# Patient Record
Sex: Female | Born: 1980 | Race: Black or African American | Hispanic: No | Marital: Single | Smoking: Current every day smoker
Health system: Southern US, Community
[De-identification: ages and names within clinical notes are randomized; demographics above are authoritative.]

## PROBLEM LIST (undated history)

## (undated) ENCOUNTER — Inpatient Hospital Stay (HOSPITAL_COMMUNITY): Payer: Self-pay

## (undated) DIAGNOSIS — A6000 Herpesviral infection of urogenital system, unspecified: Secondary | ICD-10-CM

## (undated) DIAGNOSIS — G43909 Migraine, unspecified, not intractable, without status migrainosus: Secondary | ICD-10-CM

## (undated) DIAGNOSIS — F1410 Cocaine abuse, uncomplicated: Secondary | ICD-10-CM

## (undated) HISTORY — PX: ARTERY REPAIR: SHX559

## (undated) HISTORY — PX: FOOT SURGERY: SHX648

---
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
## 2002-04-18 ENCOUNTER — Ambulatory Visit (HOSPITAL_COMMUNITY): Admission: RE | Admit: 2002-04-18 | Discharge: 2002-04-18 | Payer: Self-pay | Admitting: *Deleted

## 2002-06-22 ENCOUNTER — Encounter (INDEPENDENT_AMBULATORY_CARE_PROVIDER_SITE_OTHER): Payer: Self-pay | Admitting: Specialist

## 2002-06-22 ENCOUNTER — Inpatient Hospital Stay (HOSPITAL_COMMUNITY): Admission: AD | Admit: 2002-06-22 | Discharge: 2002-06-25 | Payer: Self-pay | Admitting: Family Medicine

## 2002-06-27 ENCOUNTER — Inpatient Hospital Stay (HOSPITAL_COMMUNITY): Admission: AD | Admit: 2002-06-27 | Discharge: 2002-06-27 | Payer: Self-pay | Admitting: *Deleted

## 2002-09-05 ENCOUNTER — Encounter: Payer: Self-pay | Admitting: Emergency Medicine

## 2002-09-05 ENCOUNTER — Emergency Department (HOSPITAL_COMMUNITY): Admission: EM | Admit: 2002-09-05 | Discharge: 2002-09-05 | Payer: Self-pay | Admitting: Emergency Medicine

## 2003-07-02 ENCOUNTER — Emergency Department (HOSPITAL_COMMUNITY): Admission: EM | Admit: 2003-07-02 | Discharge: 2003-07-02 | Payer: Self-pay | Admitting: Emergency Medicine

## 2003-07-02 ENCOUNTER — Encounter: Payer: Self-pay | Admitting: Emergency Medicine

## 2004-01-02 ENCOUNTER — Inpatient Hospital Stay (HOSPITAL_COMMUNITY): Admission: AD | Admit: 2004-01-02 | Discharge: 2004-01-03 | Payer: Self-pay | Admitting: *Deleted

## 2004-01-08 ENCOUNTER — Inpatient Hospital Stay (HOSPITAL_COMMUNITY): Admission: AD | Admit: 2004-01-08 | Discharge: 2004-01-08 | Payer: Self-pay | Admitting: Obstetrics & Gynecology

## 2004-01-27 ENCOUNTER — Inpatient Hospital Stay (HOSPITAL_COMMUNITY): Admission: AD | Admit: 2004-01-27 | Discharge: 2004-01-27 | Payer: Self-pay | Admitting: *Deleted

## 2004-02-18 ENCOUNTER — Ambulatory Visit (HOSPITAL_COMMUNITY): Admission: RE | Admit: 2004-02-18 | Discharge: 2004-02-18 | Payer: Self-pay | Admitting: Obstetrics and Gynecology

## 2004-03-04 ENCOUNTER — Encounter: Admission: RE | Admit: 2004-03-04 | Discharge: 2004-03-04 | Payer: Self-pay | Admitting: *Deleted

## 2004-03-25 ENCOUNTER — Encounter: Admission: RE | Admit: 2004-03-25 | Discharge: 2004-03-25 | Payer: Self-pay | Admitting: Family Medicine

## 2004-04-21 ENCOUNTER — Encounter: Admission: RE | Admit: 2004-04-21 | Discharge: 2004-04-21 | Payer: Self-pay | Admitting: *Deleted

## 2004-04-29 ENCOUNTER — Encounter: Admission: RE | Admit: 2004-04-29 | Discharge: 2004-04-29 | Payer: Self-pay | Admitting: *Deleted

## 2004-05-05 ENCOUNTER — Ambulatory Visit (HOSPITAL_COMMUNITY): Admission: RE | Admit: 2004-05-05 | Discharge: 2004-05-05 | Payer: Self-pay | Admitting: Obstetrics and Gynecology

## 2004-05-05 ENCOUNTER — Encounter: Admission: RE | Admit: 2004-05-05 | Discharge: 2004-05-05 | Payer: Self-pay | Admitting: *Deleted

## 2004-05-19 ENCOUNTER — Encounter: Admission: RE | Admit: 2004-05-19 | Discharge: 2004-05-19 | Payer: Self-pay | Admitting: *Deleted

## 2004-05-26 ENCOUNTER — Encounter: Admission: RE | Admit: 2004-05-26 | Discharge: 2004-05-26 | Payer: Self-pay | Admitting: *Deleted

## 2004-06-10 ENCOUNTER — Encounter: Admission: RE | Admit: 2004-06-10 | Discharge: 2004-06-10 | Payer: Self-pay | Admitting: Family Medicine

## 2004-06-17 ENCOUNTER — Encounter: Admission: RE | Admit: 2004-06-17 | Discharge: 2004-06-17 | Payer: Self-pay | Admitting: Family Medicine

## 2004-06-18 ENCOUNTER — Encounter (INDEPENDENT_AMBULATORY_CARE_PROVIDER_SITE_OTHER): Payer: Self-pay | Admitting: Specialist

## 2004-06-18 ENCOUNTER — Ambulatory Visit: Payer: Self-pay | Admitting: *Deleted

## 2004-06-18 ENCOUNTER — Inpatient Hospital Stay (HOSPITAL_COMMUNITY): Admission: AD | Admit: 2004-06-18 | Discharge: 2004-06-21 | Payer: Self-pay | Admitting: *Deleted

## 2005-06-22 ENCOUNTER — Emergency Department (HOSPITAL_COMMUNITY): Admission: EM | Admit: 2005-06-22 | Discharge: 2005-06-22 | Payer: Self-pay | Admitting: Emergency Medicine

## 2005-12-22 ENCOUNTER — Inpatient Hospital Stay (HOSPITAL_COMMUNITY): Admission: AD | Admit: 2005-12-22 | Discharge: 2005-12-22 | Payer: Self-pay | Admitting: Obstetrics & Gynecology

## 2007-03-08 ENCOUNTER — Ambulatory Visit: Payer: Self-pay | Admitting: Obstetrics & Gynecology

## 2007-03-08 ENCOUNTER — Encounter (INDEPENDENT_AMBULATORY_CARE_PROVIDER_SITE_OTHER): Payer: Self-pay | Admitting: Obstetrics & Gynecology

## 2007-03-29 ENCOUNTER — Encounter: Admission: RE | Admit: 2007-03-29 | Discharge: 2007-03-29 | Payer: Self-pay | Admitting: Obstetrics & Gynecology

## 2007-08-29 ENCOUNTER — Emergency Department (HOSPITAL_COMMUNITY): Admission: EM | Admit: 2007-08-29 | Discharge: 2007-08-29 | Payer: Self-pay | Admitting: Family Medicine

## 2010-03-17 ENCOUNTER — Encounter: Payer: Self-pay | Admitting: Obstetrics & Gynecology

## 2010-03-17 ENCOUNTER — Ambulatory Visit: Payer: Self-pay | Admitting: Obstetrics and Gynecology

## 2010-03-17 LAB — CONVERTED CEMR LAB
HCV Ab: NEGATIVE
Hepatitis B Surface Ag: NEGATIVE
Pap Smear: NEGATIVE

## 2010-03-18 ENCOUNTER — Encounter: Payer: Self-pay | Admitting: Obstetrics & Gynecology

## 2010-03-18 LAB — CONVERTED CEMR LAB: Yeast Wet Prep HPF POC: NONE SEEN

## 2010-06-09 ENCOUNTER — Emergency Department (HOSPITAL_COMMUNITY): Admission: EM | Admit: 2010-06-09 | Discharge: 2010-06-09 | Payer: Self-pay | Admitting: Family Medicine

## 2010-06-24 ENCOUNTER — Ambulatory Visit: Payer: Self-pay | Admitting: Obstetrics and Gynecology

## 2010-11-12 ENCOUNTER — Ambulatory Visit (HOSPITAL_COMMUNITY)
Admission: RE | Admit: 2010-11-12 | Discharge: 2010-11-12 | Payer: Self-pay | Source: Home / Self Care | Attending: Family Medicine | Admitting: Family Medicine

## 2010-11-12 ENCOUNTER — Emergency Department (HOSPITAL_COMMUNITY)
Admission: EM | Admit: 2010-11-12 | Discharge: 2010-11-12 | Payer: Self-pay | Source: Home / Self Care | Admitting: Family Medicine

## 2010-11-12 LAB — WET PREP, GENITAL
Trich, Wet Prep: NONE SEEN
Yeast Wet Prep HPF POC: NONE SEEN

## 2010-11-12 LAB — POCT URINALYSIS DIPSTICK
Ketones, ur: NEGATIVE mg/dL
Nitrite: NEGATIVE
Protein, ur: NEGATIVE mg/dL
Specific Gravity, Urine: 1.025 (ref 1.005–1.030)
Urine Glucose, Fasting: NEGATIVE mg/dL
Urobilinogen, UA: 1 mg/dL (ref 0.0–1.0)
pH: 6 (ref 5.0–8.0)

## 2010-11-12 LAB — POCT PREGNANCY, URINE: Preg Test, Ur: NEGATIVE

## 2010-11-22 LAB — GC/CHLAMYDIA PROBE AMP, GENITAL
Chlamydia, DNA Probe: POSITIVE — AB
GC Probe Amp, Genital: NEGATIVE

## 2010-11-28 ENCOUNTER — Encounter: Payer: Self-pay | Admitting: *Deleted

## 2010-11-28 ENCOUNTER — Encounter: Payer: Self-pay | Admitting: Obstetrics & Gynecology

## 2011-01-21 LAB — HIV ANTIBODY (ROUTINE TESTING W REFLEX): HIV: NONREACTIVE

## 2011-01-21 LAB — HERPES SIMPLEX VIRUS CULTURE: Culture: DETECTED

## 2011-01-21 LAB — RPR: RPR Ser Ql: NONREACTIVE

## 2011-01-21 LAB — POCT URINALYSIS DIPSTICK
Bilirubin Urine: NEGATIVE
Glucose, UA: NEGATIVE mg/dL
Hgb urine dipstick: NEGATIVE
Ketones, ur: NEGATIVE mg/dL
Nitrite: NEGATIVE
Protein, ur: NEGATIVE mg/dL
Specific Gravity, Urine: >=1.03 (ref 1.005–1.030)
Urobilinogen, UA: 1 mg/dL (ref 0.0–1.0)
pH: 6 (ref 5.0–8.0)

## 2011-01-21 LAB — POCT PREGNANCY, URINE: Preg Test, Ur: NEGATIVE

## 2011-01-30 ENCOUNTER — Inpatient Hospital Stay (INDEPENDENT_AMBULATORY_CARE_PROVIDER_SITE_OTHER)
Admission: RE | Admit: 2011-01-30 | Discharge: 2011-01-30 | Disposition: A | Discharge status: Home / Self Care | Payer: Medicaid Other | Source: Ambulatory Visit | Attending: Family Medicine | Admitting: Family Medicine

## 2011-01-30 DIAGNOSIS — T148XXA Other injury of unspecified body region, initial encounter: Secondary | ICD-10-CM

## 2011-03-14 ENCOUNTER — Inpatient Hospital Stay (INDEPENDENT_AMBULATORY_CARE_PROVIDER_SITE_OTHER)
Admission: RE | Admit: 2011-03-14 | Discharge: 2011-03-14 | Disposition: A | Discharge status: Home / Self Care | Payer: Medicaid Other | Source: Ambulatory Visit | Attending: Family Medicine | Admitting: Family Medicine

## 2011-03-14 DIAGNOSIS — S0180XA Unspecified open wound of other part of head, initial encounter: Secondary | ICD-10-CM

## 2011-03-14 DIAGNOSIS — N760 Acute vaginitis: Secondary | ICD-10-CM

## 2011-03-25 NOTE — Discharge Summary (Signed)
NAME:  Veronica Blackwell, Veronica Blackwell NO.:  [id number]   MEDICAL RECORD NO.:  [id number]                   PATIENT TYPE:  INP   LOCATION:  9123                                 [facility]ILITY:  [facility]   PHYSICIAN:  Billey Gosling, MD                   DATE OF BIRTH:  Mar 30, 1981   DATE OF ADMISSION:  06/22/2002  DATE OF DISCHARGE:  06/25/2002                                 DISCHARGE SUMMARY   ADMISSION DIAGNOSES:  Intrauterine pregnancy at term.   DISCHARGE DIAGNOSES:  1. Postoperative day number three of low transverse cesarean section     secondary to arrest of labor and moderate variable decelerations.  2. Anemia.  3. Thrombocytopenia.   HOSPITAL COURSE:  This 30 year old African-American female G1, P0 at 38  weeks and 5 days presented to the MAU at [facility] for  a labor check. The patient was having painful contractions since 0600 on the  day of admission.  Did not have any vaginal bleeding and her membranes were  still intact.  The patient's prenatal laboratories included blood type B+,  antibody screen negative.  Hemoglobin 11.6.  Platelets 123,000.  She is  Rubella immune and GBS negative.  On day of admission her examination  revealed blood pressure 130/68.  Abdomen soft and nontender.  Digital  cervical examination revealed cervix at 3 cm, 50% effaced, and -3 station.  Recheck two hours later she was 4 cm, 60% effaced, and -2 station.  Fetal  heart tracing was reactive and she was contracting at three to four an hour.  The patient was then admitted with routine orders to labor and delivery  where she was given epidural and her membranes were artificially ruptured  with a copious amount of green meconium stained fluid.  The patient received  an amnioinfusion as well as Pitocin augmentation and on August 17 in the  morning it was decided after an adequate trial of labor the patient was  determined to have an arrest of dilatation as well as  failure to descend and  had some variable decelerations of the fetal heart rate and Dr. Gavin Potters took  the patient to the OR for a cesarean section.   PROCEDURE:  At 2111 on June 23, 2002 the patient had a low transverse  cesarean section and delivered a viable female infant with Apgars 8 at one  minute and 9 at five minutes.  Baby had spontaneous respirations, a three  vessel cord.  Placenta was delivered and cord blood sent.  Estimated blood  loss less than 1000 cc.  The patient tolerated procedure without  complications and mother and infant stable and in good condition.   DISCHARGE DISPOSITION:  The patient is stable.   DISCHARGE MEDICATIONS:  1. Depo-Provera 150 mg IM for contraception one dose given today before     discharge.  The patient is to repeat in 12 weeks.  2. Percocet  5/325 mg one p.o. q.4h. p.r.n. pain.  3. Ferrous sulfate 325 mg b.i.d. with meals.  4. Colace 100 mg one p.o. q.h.s.   FOLLOW UP:  The patient to return to MAU in two days for staple removal and  follow up at [facility] in six weeks.  Some issues to follow up on  include thrombocytopenia, patient with a weakly positive antiplatelet  antibody,  and ANA ordered here in the hospital is still pending.  This will be  followed up as an outpatient.  Platelets have been trending upwards.  On  admission they were 114,000.  They went down to 104,000 and on day of  discharge they were 110,000.                                                Billey Gosling, MD    AS/MEDQ  D:  06/25/2002  T:  06/25/2002  Job:  [id number]
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
## 2011-03-25 NOTE — Discharge Summary (Signed)
NAME:  Veronica Blackwell, GOSWAMI NO.:  [id number]   MEDICAL RECORD NO.:  [id number]                   PATIENT TYPE:  INP   LOCATION:  9146                                 [facility]   PHYSICIAN:  Broadus John T. Pamalee Leyden, MD            DATE OF BIRTH:  1981-02-12   DATE OF ADMISSION:  06/18/2004  DATE OF DISCHARGE:  06/21/2004                                 DISCHARGE SUMMARY   ADMISSION DIAGNOSES:  1. Labor at 38-0/7 weeks.  2. Low transverse cesarean section secondary to nonreassuring fetal heart     rate.   PROCEDURES:  Repeat low transverse cesarean section secondary to  nonreassuring fetal heart rate.   LABORATORY DATA:  On admission, white count 13.5, hemoglobin 11.6,  hematocrit 35.2, platelets 139,000.  RPR nonreactive.   DISCHARGE LABORATORIES:  A CBC on August 14 had a white count of 11.3,  hemoglobin 8.9, hematocrit 25.8, platelets 111,000.   HISTORY AND PHYSICAL:  The patient is a 30 year old G2, P1-0-0-1 who  presented at 38-0/7 weeks based on a 14-week ultrasound who presented with  contractions.  No rupture of membranes.  No vaginal bleeding.  With normal  fetal activity.  The patient reported them as painful 10/10 all night long.   PRENATAL CARE:  [facility] beginning at 14 weeks with complications  including a positive urine drug screen, thrombocytopenia, and history of low  transverse cesarean section in her first pregnancy secondary to  nonreassuring fetal heart rate.   PAST OBSTETRICAL HISTORY:  G1 as a low transverse cesarean section secondary  to nonreassuring fetal heart rate.   PAST GYNECOLOGICAL HISTORY:  LSIL __________, with history of Trichomonas  and history of Chlamydia x2.  The remainder of the past medical history is  negative.   PAST MEDICAL HISTORY:  Negative.   PAST SURGICAL HISTORY:  Negative.   FAMILY HISTORY:  Negative.   SOCIAL HISTORY:  Positive for tobacco and marijuana use.  History of cocaine  use  on urine drug screen in April 2005.   PRENATAL LABORATORIES:  The patient is B positive with negative antibody  screen, hematocrit 34.3, platelets 105, rubella immune, hepatitis B surface  antigen negative, syphilis negative, HIV negative, GC negative, Chlamydia  negative, GBS negative on August 4.   ADMISSION HISTORY AND PHYSICAL:  Temperature 97.3, pulse 21, blood pressure  122/73, remainder of physical examination is noncontributory.  Digital  cervical exam was 3-4 cm dilated, 95% effaced, -3 station with a bulging bag  palpated.  Fetal heart rate was baseline 145 with positive variability,  decreased reactivity and 2 moderate variable decelerations.  Tocometer  showed contractions q.2-3 minutes lasting 60 seconds in duration.   ASSESSMENT/PLAN:  This is a 38 week G2, P1-0-0-1 in active labor.  Was  admitted for a trial of labor after a previous C-section.  On June 18, 2004, the patient began to show increased variable decelerations and  moderate  reactivity on the external fetal monitoring.  The external fetal  monitor continued to show repeat fetal heart rate decelerations throughout  the trial of labor.  The plan was to take the patient for a repeat C section  secondary to a nonreassuring fetal heart rate.  On June 18, 2004, the  patient had a repeat low transverse cesarean section without complications.  Gave birth to a 5 pound 14 ounce female with Apgars 8 at 1 minute and 9 at 5  minutes with a cord pH of 7.27.  The remainder of the operation was without  complication.  The patient had routine postpartum care for three days status  post low transverse cesarean section without complication.  Complained only  of some intermittent constipation.  The patient was discharged home postop  day #3 with plans to bottle feed her infant, desiring Depo Provera as birth  control.  Bleeding had tapered off.  Pain was controlled with ibuprofen and  Percocet.   DISCHARGE MEDICATIONS:  1.  Percocet 5/325 tablets 1-2 p.o. q.4h. p.r.n. breakthrough pain.  2. Ibuprofen 600 mg p.o. q.8h. p.r.n. breakthrough pain.  3. Prenatal vitamins 1 p.o. daily.  4. Senokot S two tablets p.o. q.h.s. p.r.n. constipation.   FOLLOWUP:  The patient was instructed to follow up at [facility]  Department on August 02, 2004, at 9 a.m.  Would appreciate if the [facility] would continue to manage her Depo Provera birth control  and also assess routine 6 week postpartum check.                                               Broadus John T. Pamalee Leyden, MD    WTP/MEDQ  D:  06/21/2004  T:  06/21/2004  Job:  [id number]
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
## 2011-03-25 NOTE — Op Note (Signed)
   NAME:  Veronica Blackwell, Veronica Blackwell NO.:  [id number]   MEDICAL RECORD NO.:  [id number]                   PATIENT TYPE:  INP   LOCATION:  9123                                 [facility]   PHYSICIAN:  Conni Elliot, M.D.             DATE OF BIRTH:  Mar 12, 1981   DATE OF PROCEDURE:  06/23/2002  DATE OF DISCHARGE:  06/25/2002                                 OPERATIVE REPORT   PREOPERATIVE DIAGNOSES:  Arrest of dilatation, failure to descend with  variable decelerations.   POSTOPERATIVE DIAGNOSES:  Arrest of dilatation, failure to descend with  variable decelerations, short umbilical cord.   OPERATION:  Low transverse cesarean.   OPERATOR:  Conni Elliot, M.D., Deirdre Poe, C.N.M.   OPERATIVE FINDINGS:  Female with Apgars of 8 and 9, weight 5 pounds 7 ounces.  Placenta was sent to pathology.   OPERATIVE PROCEDURE:  The patient was brought to the operating room and  continuous lumbar epidural anesthetic was brought to the operative level.  The patient supine in left lateral tilt position, receiving oxygen.  Abdomen  was prepped and draped in sterile fashion.  Low transverse Pfannenstiel  incision made.  Incision made through skin and fascia.  Rectus muscles  separated midline.  Peritoneal cavity and bladder flap created.  Low  transverse uterine incision was made.  Infant delivered in vertex  presentation and was DeLee suctioned prior delivery of chest and the body  delivered.  Cord doubly clamped and cut and baby handed to neonatology in  attendance.  Placenta was delivered spontaneously.  Uterus, bladder flap,  anterior peritoneum, fascia, subcutaneous, skin were closed in routine  fashion.  Estimated blood loss less than 800 cc.  Needle counts correct.                                               Conni Elliot, M.D.    ASG/MEDQ  D:  07/28/2002  T:  07/29/2002  Job:  [id number]

## 2011-03-25 NOTE — Op Note (Signed)
NAME:  Veronica Blackwell, Veronica Blackwell NO.:  [id number]   MEDICAL RECORD NO.:  [id number]                   PATIENT TYPE:  INP   LOCATION:  9146                                 [facility]   PHYSICIAN:  Conni Elliot, M.D.             DATE OF BIRTH:  Dec 20, 1980   DATE OF PROCEDURE:  06/18/2004  DATE OF DISCHARGE:                                 OPERATIVE REPORT   PREOPERATIVE DIAGNOSES:  Repetitive fetal decelerations and prior cesarean  delivery.   POSTOPERATIVE DIAGNOSES:  Repetitive fetal decelerations and prior cesarean  delivery.   OPERATION:  Low transverse cesarean.   SURGEON:  Conni Elliot, M.D.   ANESTHESIA:  Continuous lumbar epidural.   FINDINGS:  A 5 pound 14 ounce female with Apgar's of 8 and 9, cord pH of  7.27, placenta was sent to pathology.   DESCRIPTION OF PROCEDURE:  After placing the patient on continuous lumbar  epidural anesthetic, the patient in the supine left tilt position, abdomen  was prepped and draped in a sterile fashion, low transverse Pfannenstiel  incision was made. An incision made through the skin and fascia, rectus  muscles separated in the midline, peritoneum entered, bladder flap created,  low transverse uterine incision was made. The baby was in a vertex  presentation, the baby was DeLee suctioned. Prior to delivery of the  remainder of the body, cord doubly clamped and cut, baby handed to the  neonatology in attendance.  The placenta was delivered spontaneously. The  uterus, bladder flap, anterior peritoneum, fascia and subcutaneous skin were  closed in an adequate fashion. Estimated blood loss was approximately 800  mL.                                               Conni Elliot, M.D.    ASG/MEDQ  D:  06/18/2004  T:  06/19/2004  Job:  [id number]

## 2011-07-26 ENCOUNTER — Telehealth: Payer: Self-pay | Admitting: *Deleted

## 2011-07-26 NOTE — Telephone Encounter (Signed)
Pt. Called this am and left a message at 0815 and states " I do understand you said call my pharmacist for refills, but they said I need to have you call and tell them I need a refill. Called pt at 10:22 am and she states she was given acyclovir 200mg  and is taking 2 of those BID and is almost out- wants a refill- uses [address] [employer]. Informed pt at that time computers down- when up will route her request to MD and if approved will notify her- may be today, or next day or so.  Pt. Voices understanding. 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 Marice Potter

## 2011-08-17 LAB — POCT URINALYSIS DIP (DEVICE)
Hgb urine dipstick: NEGATIVE
Ketones, ur: 80 — AB
pH: 6

## 2013-03-07 ENCOUNTER — Emergency Department (HOSPITAL_COMMUNITY)
Admission: EM | Admit: 2013-03-07 | Discharge: 2013-03-07 | Disposition: A | Discharge status: Home / Self Care | Payer: Medicaid Other | Attending: Emergency Medicine | Admitting: Emergency Medicine

## 2013-03-07 ENCOUNTER — Encounter (HOSPITAL_COMMUNITY): Payer: Self-pay | Admitting: Emergency Medicine

## 2013-03-07 DIAGNOSIS — G43909 Migraine, unspecified, not intractable, without status migrainosus: Secondary | ICD-10-CM

## 2013-03-07 DIAGNOSIS — Z91040 Latex allergy status: Secondary | ICD-10-CM | POA: Insufficient documentation

## 2013-03-07 DIAGNOSIS — R110 Nausea: Secondary | ICD-10-CM | POA: Insufficient documentation

## 2013-03-07 DIAGNOSIS — F17200 Nicotine dependence, unspecified, uncomplicated: Secondary | ICD-10-CM | POA: Insufficient documentation

## 2013-03-07 DIAGNOSIS — H53149 Visual discomfort, unspecified: Secondary | ICD-10-CM | POA: Insufficient documentation

## 2013-03-07 HISTORY — DX: Migraine, unspecified, not intractable, without status migrainosus: G43.909

## 2013-03-07 MED ORDER — PROCHLORPERAZINE EDISYLATE 5 MG/ML IJ SOLN
10.0000 mg | Freq: Once | INTRAMUSCULAR | Status: AC
  Administered 2013-03-07: 10 mg via INTRAMUSCULAR
  Filled 2013-03-07: qty 2

## 2013-03-07 MED ORDER — KETOROLAC TROMETHAMINE 60 MG/2ML IM SOLN
60.0000 mg | Freq: Once | INTRAMUSCULAR | Status: AC
  Administered 2013-03-07: 60 mg via INTRAMUSCULAR
  Filled 2013-03-07: qty 2

## 2013-03-07 MED ORDER — DIPHENHYDRAMINE HCL 50 MG/ML IJ SOLN
25.0000 mg | Freq: Once | INTRAMUSCULAR | Status: AC
  Administered 2013-03-07: 25 mg via INTRAMUSCULAR
  Filled 2013-03-07: qty 1

## 2013-03-07 MED ORDER — TRAMADOL HCL 50 MG PO TABS: 50.0000 mg | ORAL_TABLET | Freq: Four times a day (QID) | ORAL | Status: DC | PRN

## 2013-03-07 NOTE — ED Notes (Signed)
Pt c/o HA x 3 days. Pt states she takes goody powders for HA relief and today they have not been working. Pt rates pain 10/10.

## 2013-03-07 NOTE — ED Notes (Signed)
Discharge instructions and follow up reviewed. Pt verbalized understanding.  

## 2013-03-07 NOTE — ED Provider Notes (Signed)
History     CSN: [id number]  Arrival date & time 03/07/13  1445   First MD Initiated Contact with Patient 03/07/13 1505      No chief complaint on file.   (Consider location/radiation/quality/duration/timing/severity/associated sxs/prior treatment) HPI Comments: Patient comes to the ER for evaluation of headache. Patient reports a history of recurrent migraine headache. Patient has a throbbing pain on the right side of her head that is worsened by bright lights. She has mild nausea, no vomiting. There is no neck pain or stiffness. Headache started 4 days ago and has slowly worsened. This is consistent with her previous migraines. There are no unusual features compared to previous migraines.   Past Medical History  Diagnosis Date  . Migraine     No past surgical history on file.  No family history on file.  History  Substance Use Topics  . Smoking status: Current Every Day Smoker  . Smokeless tobacco: Not on file  . Alcohol Use: Yes    OB History   Grav Para Term Preterm Abortions TAB SAB Ect Mult Living                  Review of Systems  Eyes: Positive for photophobia.  Gastrointestinal: Positive for nausea. Negative for vomiting.  Neurological: Positive for headaches. Negative for weakness.  All other systems reviewed and are negative.    Allergies  Latex  Home Medications  No current outpatient prescriptions on file.  BP 116/73  Pulse 73  Temp(Src) 98.7 F (37.1 C)  Resp 16  SpO2 100%  Physical Exam  Constitutional: She is oriented to person, place, and time. She appears well-developed and well-nourished. No distress.  HENT:  Head: Normocephalic and atraumatic.  Right Ear: Hearing normal.  Nose: Nose normal.  Mouth/Throat: Oropharynx is clear and moist and mucous membranes are normal.  Eyes: Conjunctivae and EOM are normal. Pupils are equal, round, and reactive to light.  Neck: Normal range of motion. Neck supple.  Cardiovascular: Normal rate,  regular rhythm, S1 normal and S2 normal.  Exam reveals no gallop and no friction rub.   No murmur heard. Pulmonary/Chest: Effort normal and breath sounds normal. No respiratory distress. She exhibits no tenderness.  Abdominal: Soft. Normal appearance and bowel sounds are normal. There is no hepatosplenomegaly. There is no tenderness. There is no rebound, no guarding, no tenderness at McBurney's point and negative Murphy's sign. No hernia.  Musculoskeletal: Normal range of motion.  Neurological: She is alert and oriented to person, place, and time. She has normal strength. No cranial nerve deficit or sensory deficit. Coordination normal. GCS eye subscore is 4. GCS verbal subscore is 5. GCS motor subscore is 6.  Reflex Scores:      Tricep reflexes are 1+ on the right side and 1+ on the left side.      Bicep reflexes are 1+ on the right side and 1+ on the left side.      Patellar reflexes are 1+ on the right side and 1+ on the left side. Skin: Skin is warm, dry and intact. No rash noted. No cyanosis.  Psychiatric: She has a normal mood and affect. Her speech is normal and behavior is normal. Thought content normal.    ED Course  Procedures (including critical care time)  Labs Reviewed - No data to display No results found.   Diagnosis: Migraine headache    MDM  This presents to the ER with headache. Patient has a history of recurrent migraines. The  headache she is experiencing today is identical to previous headaches that she has had without any unusual features. Neurologic exam is entirely unremarkable. There is therefore no need for any imaging today. Patient treated for acute migraine headache. Has never seen a neurologist, will give followup information.        Gilda Crease, MD 03/07/13 1515

## 2013-03-07 NOTE — ED Notes (Signed)
Migraine x years got worse 3 days ago and now blurry vision

## 2013-07-15 ENCOUNTER — Encounter (HOSPITAL_COMMUNITY): Payer: Self-pay | Admitting: Emergency Medicine

## 2013-07-15 ENCOUNTER — Emergency Department (HOSPITAL_COMMUNITY)
Admission: EM | Admit: 2013-07-15 | Discharge: 2013-07-15 | Disposition: A | Discharge status: Home / Self Care | Payer: Medicaid Other | Attending: Emergency Medicine | Admitting: Emergency Medicine

## 2013-07-15 DIAGNOSIS — S335XXA Sprain of ligaments of lumbar spine, initial encounter: Secondary | ICD-10-CM | POA: Insufficient documentation

## 2013-07-15 DIAGNOSIS — Y929 Unspecified place or not applicable: Secondary | ICD-10-CM | POA: Insufficient documentation

## 2013-07-15 DIAGNOSIS — S39012A Strain of muscle, fascia and tendon of lower back, initial encounter: Secondary | ICD-10-CM

## 2013-07-15 DIAGNOSIS — F17200 Nicotine dependence, unspecified, uncomplicated: Secondary | ICD-10-CM | POA: Insufficient documentation

## 2013-07-15 DIAGNOSIS — Z91040 Latex allergy status: Secondary | ICD-10-CM | POA: Insufficient documentation

## 2013-07-15 DIAGNOSIS — Y939 Activity, unspecified: Secondary | ICD-10-CM | POA: Insufficient documentation

## 2013-07-15 DIAGNOSIS — G8929 Other chronic pain: Secondary | ICD-10-CM | POA: Insufficient documentation

## 2013-07-15 DIAGNOSIS — X58XXXA Exposure to other specified factors, initial encounter: Secondary | ICD-10-CM | POA: Insufficient documentation

## 2013-07-15 DIAGNOSIS — Z8679 Personal history of other diseases of the circulatory system: Secondary | ICD-10-CM | POA: Insufficient documentation

## 2013-07-15 MED ORDER — IBUPROFEN 600 MG PO TABS: 600.0000 mg | ORAL_TABLET | Freq: Four times a day (QID) | ORAL | Status: DC | PRN

## 2013-07-15 MED ORDER — TRAMADOL HCL 50 MG PO TABS: 50.0000 mg | ORAL_TABLET | Freq: Four times a day (QID) | ORAL | Status: DC | PRN

## 2013-07-15 MED ORDER — METHOCARBAMOL 500 MG PO TABS: 500.0000 mg | ORAL_TABLET | Freq: Two times a day (BID) | ORAL | Status: DC

## 2013-07-15 MED ORDER — IBUPROFEN 400 MG PO TABS
600.0000 mg | ORAL_TABLET | Freq: Once | ORAL | Status: AC
  Administered 2013-07-15: 600 mg via ORAL
  Filled 2013-07-15 (×2): qty 1

## 2013-07-15 MED ORDER — TRAMADOL HCL 50 MG PO TABS
50.0000 mg | ORAL_TABLET | Freq: Once | ORAL | Status: DC
  Filled 2013-07-15: qty 1

## 2013-07-15 MED ORDER — METHOCARBAMOL 500 MG PO TABS
500.0000 mg | ORAL_TABLET | Freq: Once | ORAL | Status: AC
  Administered 2013-07-15: 500 mg via ORAL
  Filled 2013-07-15: qty 1

## 2013-07-15 NOTE — ED Notes (Signed)
Pt c/o lower back pain worse with movement and position change x 3 days

## 2013-07-15 NOTE — ED Notes (Signed)
MD at bedside. 

## 2013-07-15 NOTE — ED Provider Notes (Signed)
CSN: [id number]     Arrival date & time 07/15/13  1852 History   First MD Initiated Contact with Patient 07/15/13 1931     Chief Complaint  Patient presents with  . Back Pain   (Consider location/radiation/quality/duration/timing/severity/associated sxs/prior Treatment) HPI Patient is a 32 year old female with chronic lower back pain. States she's had acute worsening of her lower back pain for the past 2-3 days. She does not remember any inciting event. The pain does not radiate down her legs. She has no focal weakness or numbness. She has no bladder or bowel incontinence. She has no fevers or chills. She has been taking NSAIDs at home with minimal relief. Pain is worse with movement and palpation. Past Medical History  Diagnosis Date  . Migraine    History reviewed. No pertinent past surgical history. History reviewed. No pertinent family history. History  Substance Use Topics  . Smoking status: Current Every Day Smoker  . Smokeless tobacco: Not on file  . Alcohol Use: Yes   OB History   Grav Para Term Preterm Abortions TAB SAB Ect Mult Living                 Review of Systems  Constitutional: Negative for fever and chills.  HENT: Negative for neck pain.   Respiratory: Negative for shortness of breath.   Cardiovascular: Negative for chest pain.  Gastrointestinal: Negative for nausea, vomiting and abdominal pain.  Genitourinary: Negative for difficulty urinating.  Musculoskeletal: Positive for myalgias and back pain. Negative for arthralgias and gait problem.  Skin: Negative for rash and wound.  Neurological: Negative for dizziness, weakness, light-headedness, numbness and headaches.  All other systems reviewed and are negative.    Allergies  Latex  Home Medications   Current Outpatient Rx  Name  Route  Sig  Dispense  Refill  . Aspirin-Acetaminophen-Caffeine (GOODY HEADACHE PO)   Oral   Take 1 packet by mouth 2 (two) times daily as needed (headaches).          BP  127/86  Pulse 92  Temp(Src) 98.7 F (37.1 C) (Oral)  Resp 18  SpO2 100% Physical Exam  Nursing note and vitals reviewed. Constitutional: She is oriented to person, place, and time. She appears well-developed and well-nourished. No distress.  HENT:  Head: Normocephalic and atraumatic.  Mouth/Throat: Oropharynx is clear and moist.  Eyes: EOM are normal. Pupils are equal, round, and reactive to light.  Neck: Normal range of motion. Neck supple.  Cardiovascular: Normal rate and regular rhythm.   Pulmonary/Chest: Effort normal and breath sounds normal. No respiratory distress. She has no wheezes. She has no rales.  Abdominal: Soft. Bowel sounds are normal. She exhibits no distension and no mass. There is no tenderness. There is no rebound and no guarding.  Musculoskeletal: Normal range of motion. She exhibits tenderness. She exhibits no edema.  Tenderness to palpation over bilateral paraspinal lumbar muscles. No thoracic or lumbar midline tenderness to palpation. No step-offs or deformities. Patient has bilateral negative straight-leg raises. Neurovascular intact.  Neurological: She is alert and oriented to person, place, and time.  Patient is alert and oriented x3 with clear, goal oriented speech. Patient has 5/5 motor in all extremities. Sensation is intact to light touch. No saddle anesthesia Patient has a normal gait and walks without assistance.   Skin: Skin is warm and dry. No rash noted. No erythema.  Psychiatric: She has a normal mood and affect. Her behavior is normal.    ED Course  Procedures (including  critical care time) Labs Review Labs Reviewed - No data to display Imaging Review No results found.  MDM  No diagnosis found. Patient has no red flag findings on history or physical exam. The patient has acute exacerbation of chronic back pain. This pain appears to be muscular in origin it does not seem to be radicular. We'll treat with NSAIDs, muscle relaxant and mild  narcotic. Return discussions have been given and patient was understanding    Loren Racer, MD 07/15/13 1949

## 2013-07-29 ENCOUNTER — Encounter (HOSPITAL_COMMUNITY): Payer: Self-pay

## 2013-07-29 ENCOUNTER — Emergency Department (INDEPENDENT_AMBULATORY_CARE_PROVIDER_SITE_OTHER)
Admission: EM | Admit: 2013-07-29 | Discharge: 2013-07-29 | Disposition: A | Discharge status: Home / Self Care | Payer: Medicaid Other | Source: Home / Self Care | Attending: Family Medicine | Admitting: Family Medicine

## 2013-07-29 DIAGNOSIS — R51 Headache: Secondary | ICD-10-CM

## 2013-07-29 MED ORDER — DIPHENHYDRAMINE HCL 50 MG/ML IJ SOLN
INTRAMUSCULAR | Status: AC
  Filled 2013-07-29: qty 1

## 2013-07-29 MED ORDER — METHYLPREDNISOLONE 4 MG PO KIT: PACK | ORAL | Status: DC

## 2013-07-29 MED ORDER — METHYLPREDNISOLONE ACETATE 40 MG/ML IJ SUSP
80.0000 mg | Freq: Once | INTRAMUSCULAR | Status: AC
  Administered 2013-07-29: 80 mg via INTRAMUSCULAR

## 2013-07-29 MED ORDER — ONDANSETRON 4 MG PO TBDP
8.0000 mg | ORAL_TABLET | Freq: Once | ORAL | Status: AC
  Administered 2013-07-29: 8 mg via ORAL

## 2013-07-29 MED ORDER — DIPHENHYDRAMINE HCL 50 MG/ML IJ SOLN
50.0000 mg | Freq: Once | INTRAMUSCULAR | Status: AC
  Administered 2013-07-29: 50 mg via INTRAMUSCULAR

## 2013-07-29 MED ORDER — METHYLPREDNISOLONE ACETATE 80 MG/ML IJ SUSP
INTRAMUSCULAR | Status: AC
  Filled 2013-07-29: qty 1

## 2013-07-29 MED ORDER — ONDANSETRON 4 MG PO TBDP
ORAL_TABLET | ORAL | Status: AC
  Filled 2013-07-29: qty 2

## 2013-07-29 NOTE — ED Notes (Signed)
MD at bedside. 

## 2013-07-29 NOTE — ED Notes (Signed)
Frequent HA history; not responding to usual HA methods, making her nauseated ,causing eye problem today

## 2013-07-29 NOTE — ED Provider Notes (Addendum)
CSN: [id number]     Arrival date & time 07/29/13  1807 History   First MD Initiated Contact with Patient 07/29/13 1856     Chief Complaint  Patient presents with  . Headache   (Consider location/radiation/quality/duration/timing/severity/associated sxs/prior Treatment) Patient is a 32 y.o. female presenting with headaches. The history is provided by the patient.  Headache Pain location:  R temporal Quality:  Dull Radiates to:  R neck Onset quality:  Gradual Duration:  3 days Progression:  Unchanged Chronicity:  Recurrent Similar to prior headaches: yes   Context: bright light   Ineffective treatments:  NSAIDs Associated symptoms: nausea   Associated symptoms: no blurred vision, no congestion, no fever, no neck pain, no numbness, no sinus pressure and no sore throat     Past Medical History  Diagnosis Date  . Migraine    History reviewed. No pertinent past surgical history. History reviewed. No pertinent family history. History  Substance Use Topics  . Smoking status: Current Every Day Smoker  . Smokeless tobacco: Not on file  . Alcohol Use: Yes   OB History   Grav Para Term Preterm Abortions TAB SAB Ect Mult Living                 Review of Systems  Constitutional: Negative.  Negative for fever.  HENT: Negative for congestion, sore throat, neck pain and sinus pressure.   Eyes: Negative for blurred vision.  Gastrointestinal: Positive for nausea.  Genitourinary: Negative.  Negative for menstrual problem.  Neurological: Positive for headaches. Negative for numbness.    Allergies  Latex  Home Medications   Current Outpatient Rx  Name  Route  Sig  Dispense  Refill  . Aspirin-Acetaminophen-Caffeine (GOODY HEADACHE PO)   Oral   Take 1 packet by mouth 2 (two) times daily as needed (headaches).         Marland Kitchen ibuprofen (ADVIL,MOTRIN) 600 MG tablet   Oral   Take 1 tablet (600 mg total) by mouth every 6 (six) hours as needed for pain.   30 tablet   0   .  methocarbamol (ROBAXIN) 500 MG tablet   Oral   Take 1 tablet (500 mg total) by mouth 2 (two) times daily.   20 tablet   0   . methylPREDNISolone (MEDROL DOSEPAK) 4 MG tablet      follow package directions, start on tues, take until finished   21 tablet   0   . traMADol (ULTRAM) 50 MG tablet   Oral   Take 1 tablet (50 mg total) by mouth every 6 (six) hours as needed for pain.   15 tablet   0   . valACYclovir (VALTREX) 500 MG tablet   Oral   Take 500 mg by mouth daily.          BP 114/86  Pulse 68  Temp(Src) 98.7 F (37.1 C) (Oral)  Resp 20  SpO2 100%  LMP 07/20/2013 Physical Exam  Nursing note and vitals reviewed. Constitutional: She is oriented to person, place, and time. She appears well-developed and well-nourished.  HENT:  Head: Normocephalic.  Right Ear: External ear normal.  Left Ear: External ear normal.  Mouth/Throat: Oropharynx is clear and moist.  Eyes: Conjunctivae and EOM are normal. Pupils are equal, round, and reactive to light.  Neck: Normal range of motion. Neck supple.  Cardiovascular: Normal rate and regular rhythm.   Pulmonary/Chest: Effort normal and breath sounds normal.  Lymphadenopathy:    She has no cervical adenopathy.  Neurological:  She is alert and oriented to person, place, and time. No cranial nerve deficit.  Skin: Skin is warm and dry.    ED Course  Procedures (including critical care time) Labs Review Labs Reviewed - No data to display Imaging Review No results found.  MDM      Linna Hoff, MD 07/29/13 1918  Linna Hoff, MD 07/29/13 2125

## 2013-07-30 ENCOUNTER — Encounter (HOSPITAL_COMMUNITY): Payer: Self-pay | Admitting: Emergency Medicine

## 2013-07-30 ENCOUNTER — Emergency Department (HOSPITAL_COMMUNITY)
Admission: EM | Admit: 2013-07-30 | Discharge: 2013-07-30 | Disposition: A | Discharge status: Home / Self Care | Payer: Medicaid Other | Attending: Emergency Medicine | Admitting: Emergency Medicine

## 2013-07-30 DIAGNOSIS — Z79899 Other long term (current) drug therapy: Secondary | ICD-10-CM | POA: Insufficient documentation

## 2013-07-30 DIAGNOSIS — G43909 Migraine, unspecified, not intractable, without status migrainosus: Secondary | ICD-10-CM

## 2013-07-30 DIAGNOSIS — Z87891 Personal history of nicotine dependence: Secondary | ICD-10-CM | POA: Insufficient documentation

## 2013-07-30 DIAGNOSIS — Z91040 Latex allergy status: Secondary | ICD-10-CM | POA: Insufficient documentation

## 2013-07-30 MED ORDER — KETOROLAC TROMETHAMINE 30 MG/ML IJ SOLN
30.0000 mg | Freq: Once | INTRAMUSCULAR | Status: AC
  Administered 2013-07-30: 30 mg via INTRAVENOUS
  Filled 2013-07-30: qty 1

## 2013-07-30 MED ORDER — SODIUM CHLORIDE 0.9 % IV BOLUS (SEPSIS): 1000.0000 mL | Freq: Once | INTRAVENOUS | Status: DC

## 2013-07-30 MED ORDER — DIPHENHYDRAMINE HCL 50 MG/ML IJ SOLN
25.0000 mg | Freq: Once | INTRAMUSCULAR | Status: AC
  Administered 2013-07-30: 25 mg via INTRAVENOUS
  Filled 2013-07-30: qty 1

## 2013-07-30 MED ORDER — DEXAMETHASONE SODIUM PHOSPHATE 10 MG/ML IJ SOLN
10.0000 mg | Freq: Once | INTRAMUSCULAR | Status: AC
  Administered 2013-07-30: 10 mg via INTRAVENOUS
  Filled 2013-07-30: qty 1

## 2013-07-30 MED ORDER — METOCLOPRAMIDE HCL 5 MG/ML IJ SOLN
10.0000 mg | Freq: Once | INTRAMUSCULAR | Status: AC
Start: 2013-07-30 — End: 2013-07-30
  Administered 2013-07-30: 10 mg via INTRAVENOUS
  Filled 2013-07-30: qty 2

## 2013-07-30 MED ORDER — BUTALBITAL-APAP-CAFFEINE 50-325-40 MG PO TABS: 1.0000 | ORAL_TABLET | Freq: Four times a day (QID) | ORAL | Status: AC | PRN

## 2013-07-30 NOTE — ED Provider Notes (Signed)
CSN: [id number]     Arrival date & time 07/30/13  1540 History   First MD Initiated Contact with Patient 07/30/13 1617     Chief Complaint  Patient presents with  . Migraine    Right   (Consider location/radiation/quality/duration/timing/severity/associated sxs/prior Treatment) HPI Comments: Patient is a 32 year old female with history of migraines who presents today with 2 days of gradually worsening headache. The headache is worse on the right side and behind her eyes. She describes the headache as throbbing. She took 3 packages of BC powder, but the [facility] powder made her nauseous so she did not take any more. She ate crackers to help with her nausea. She had photophobia, but her symptoms are improving and she no longer feels sensitive to light. This migraine feels identical to her typical migraine. She was here for similar migraine 1 month ago. She reports that she was treated with a headache cocktail. She denies fevers, chills, vomiting, abdominal pain, shortness of breath, chest pain, recent illness.   The history is provided by the patient. No language interpreter was used.    Past Medical History  Diagnosis Date  . Migraine    Past Surgical History  Procedure Laterality Date  . Artery repair      L wrist  . Foot surgery      Right  . Cesarean section     No family history on file. History  Substance Use Topics  . Smoking status: Former Games developer  . Smokeless tobacco: Never Used  . Alcohol Use: Yes     Comment: Social   OB History   Grav Para Term Preterm Abortions TAB SAB Ect Mult Living                 Review of Systems  Constitutional: Negative for fever.  HENT: Negative for neck pain and neck stiffness.   Eyes: Positive for photophobia.  Respiratory: Negative for shortness of breath.   Cardiovascular: Negative for chest pain.  Gastrointestinal: Positive for nausea. Negative for vomiting and abdominal pain.  Neurological: Positive for headaches.  All other systems  reviewed and are negative.    Allergies  Latex  Home Medications   Current Outpatient Rx  Name  Route  Sig  Dispense  Refill  . Aspirin-Acetaminophen-Caffeine (GOODY HEADACHE PO)   Oral   Take 2-4 packets by mouth 3 (three) times daily as needed (headaches).          Marland Kitchen ibuprofen (ADVIL,MOTRIN) 200 MG tablet   Oral   Take 800 mg by mouth every 8 (eight) hours as needed for pain.         . valACYclovir (VALTREX) 500 MG tablet   Oral   Take 500 mg by mouth daily.          BP 133/90  Pulse 84  Temp(Src) 98.9 F (37.2 C) (Oral)  Ht 5\' 1"  (1.549 m)  Wt 192 lb 3.2 oz (87.181 kg)  BMI 36.33 kg/m2  SpO2 100%  LMP 07/20/2013 Physical Exam  Nursing note and vitals reviewed. Constitutional: She is oriented to person, place, and time. She appears well-developed and well-nourished. She does not appear ill. No distress.  HENT:  Head: Normocephalic and atraumatic.  Right Ear: External ear normal.  Left Ear: External ear normal.  Nose: Nose normal. Right sinus exhibits no maxillary sinus tenderness and no frontal sinus tenderness. Left sinus exhibits no maxillary sinus tenderness and no frontal sinus tenderness.  Mouth/Throat: Oropharynx is clear and moist.  Nose temporal  artery tenderness  Eyes: Conjunctivae and EOM are normal. Pupils are equal, round, and reactive to light.  No photophobia.  Neck: Trachea normal, normal range of motion and phonation normal.  No nuchal rigidity or meningeal signs  Cardiovascular: Normal rate, regular rhythm and normal heart sounds.   Pulmonary/Chest: Effort normal and breath sounds normal. No stridor. No respiratory distress. She has no wheezes. She has no rales.  Abdominal: Soft. She exhibits no distension. There is no tenderness. There is no rigidity and no guarding.  Musculoskeletal: Normal range of motion.  Neurological: She is alert and oriented to person, place, and time. She has normal strength. No cranial nerve deficit or sensory  deficit. Coordination and gait normal.  Gait normal without ataxia.   Skin: Skin is warm and dry. She is not diaphoretic. No erythema.  Psychiatric: She has a normal mood and affect. Her behavior is normal.    ED Course  Procedures (including critical care time) Labs Review Labs Reviewed - No data to display Imaging Review No results found.  MDM   1. Migraine    Pt HA treated and improved while in ED.  Presentation is like pts typical HA and non concerning for [facility], ICH, Meningitis, or temporal arteritis. Pt is afebrile with no focal neuro deficits, nuchal rigidity, or change in vision. Pt is to follow up with PCP to discuss prophylactic medication. Pt verbalizes understanding and is agreeable with plan to dc. She was given small course of Fioricet.   Mora Bellman, PA-C 07/30/13 [phone]

## 2013-07-30 NOTE — ED Notes (Signed)
Pt presents to ED via POV.  Pt c/o of migraine headache that started 2 nights ago, unrelieved by medications at home.  Pt reports photosensitivity and blurred vision in the R eye.  Pt reports dizziness and nausea with out emesis.

## 2013-07-31 NOTE — ED Provider Notes (Signed)
Medical screening examination/treatment/procedure(s) were performed by non-physician practitioner and as supervising physician I was immediately available for consultation/collaboration.     Celene Kras, MD 07/31/13 1540

## 2013-08-16 ENCOUNTER — Ambulatory Visit: Payer: Self-pay | Admitting: Neurology

## 2013-09-03 ENCOUNTER — Ambulatory Visit: Payer: Medicaid Other | Admitting: Neurology

## 2014-02-01 ENCOUNTER — Encounter (HOSPITAL_COMMUNITY): Payer: Self-pay | Admitting: Emergency Medicine

## 2014-02-01 ENCOUNTER — Emergency Department (HOSPITAL_COMMUNITY)
Admission: EM | Admit: 2014-02-01 | Discharge: 2014-02-01 | Disposition: A | Discharge status: Home / Self Care | Payer: Medicaid Other | Attending: Emergency Medicine | Admitting: Emergency Medicine

## 2014-02-01 DIAGNOSIS — Z91040 Latex allergy status: Secondary | ICD-10-CM | POA: Insufficient documentation

## 2014-02-01 DIAGNOSIS — Z79899 Other long term (current) drug therapy: Secondary | ICD-10-CM | POA: Insufficient documentation

## 2014-02-01 DIAGNOSIS — K089 Disorder of teeth and supporting structures, unspecified: Secondary | ICD-10-CM | POA: Insufficient documentation

## 2014-02-01 DIAGNOSIS — G43909 Migraine, unspecified, not intractable, without status migrainosus: Secondary | ICD-10-CM | POA: Insufficient documentation

## 2014-02-01 DIAGNOSIS — Z87891 Personal history of nicotine dependence: Secondary | ICD-10-CM | POA: Insufficient documentation

## 2014-02-01 DIAGNOSIS — K0889 Other specified disorders of teeth and supporting structures: Secondary | ICD-10-CM

## 2014-02-01 MED ORDER — BUPIVACAINE-EPINEPHRINE PF 0.5-1:200000 % IJ SOLN: 1.8000 mL | Freq: Once | INTRAMUSCULAR | Status: DC

## 2014-02-01 MED ORDER — IBUPROFEN 800 MG PO TABS
800.0000 mg | ORAL_TABLET | Freq: Once | ORAL | Status: AC
Start: 2014-02-01 — End: 2014-02-01
  Administered 2014-02-01: 800 mg via ORAL
  Filled 2014-02-01: qty 1

## 2014-02-01 MED ORDER — PENICILLIN V POTASSIUM 500 MG PO TABS
1000.0000 mg | ORAL_TABLET | Freq: Two times a day (BID) | ORAL | Status: DC
Start: 2014-02-01 — End: 2015-12-23

## 2014-02-01 NOTE — ED Notes (Signed)
She tells us she has left back upper toothache with a "real sore gum".  She is in no distress and Dr. Criss AlvineGoldston has just examined her.

## 2014-02-01 NOTE — ED Notes (Signed)
Pt states she has had L lower dental pain for past two days.

## 2014-02-01 NOTE — ED Provider Notes (Signed)
CSN: [id number]     Arrival date & time 02/01/14  4098 History   First MD Initiated Contact with Patient 02/01/14 [phone]     Chief Complaint  Patient presents with  . Dental Pain     (Consider location/radiation/quality/duration/timing/severity/associated sxs/prior Treatment) HPI Comments: 32 year old female presents with left lower dental pain over the past 2 days. She states started after eating fried chicken. Denies any fevers or chills. She's not sure exactly which tooth it is but knows one of her mandibular molars. She called her dentist but cannot be seen for one month. She tried ibuprofen and Oragel. Denies any swelling. The pain at this time is severe.   Past Medical History  Diagnosis Date  . Migraine    Past Surgical History  Procedure Laterality Date  . Artery repair      L wrist  . Foot surgery      Right  . Cesarean section     History reviewed. No pertinent family history. History  Substance Use Topics  . Smoking status: Former Games developer  . Smokeless tobacco: Never Used  . Alcohol Use: Yes     Comment: Social   OB History   Grav Para Term Preterm Abortions TAB SAB Ect Mult Living                 Review of Systems  Constitutional: Negative for fever.  HENT: Positive for dental problem. Negative for facial swelling, trouble swallowing and voice change.   All other systems reviewed and are negative.      Allergies  Latex  Home Medications   Current Outpatient Rx  Name  Route  Sig  Dispense  Refill  . Aspirin-Acetaminophen-Caffeine (GOODY HEADACHE PO)   Oral   Take 2-4 packets by mouth 3 (three) times daily as needed (headaches).          . butalbital-acetaminophen-caffeine (FIORICET) 50-325-40 MG per tablet   Oral   Take 1-2 tablets by mouth every 6 (six) hours as needed for headache.   20 tablet   0   . ibuprofen (ADVIL,MOTRIN) 200 MG tablet   Oral   Take 800 mg by mouth every 8 (eight) hours as needed for pain.         . valACYclovir  (VALTREX) 500 MG tablet   Oral   Take 500 mg by mouth daily.          BP 130/83  Pulse 93  Temp(Src) 97.8 F (36.6 C) (Oral)  Resp 16  SpO2 100% Physical Exam  Nursing note and vitals reviewed. Constitutional: She is oriented to person, place, and time. She appears well-developed and well-nourished. No distress.  HENT:  Head: Normocephalic and atraumatic.  Right Ear: External ear normal.  Left Ear: External ear normal.  Nose: Nose normal.  Mouth/Throat: Uvula is midline and oropharynx is clear and moist. No trismus in the jaw. No dental abscesses or uvula swelling. No oropharyngeal exudate.    No facial swelling. No swelling beneath tongue  Eyes: Right eye exhibits no discharge. Left eye exhibits no discharge.  Cardiovascular: Normal rate and regular rhythm.   Pulmonary/Chest: Effort normal.  Abdominal: She exhibits no distension.  Neurological: She is alert and oriented to person, place, and time.  Skin: Skin is warm and dry.    ED Course  Procedures (including critical care time) Labs Review Labs Reviewed - No data to display Imaging Review No results found.   EKG Interpretation None      MDM  Final diagnoses:  Pain, dental    Patient with what appears to be uncomplicated left mandibular dental pain. No gingival erythema or swelling, no signs of dental abscess. No concern for Ludwig angina. Will treat with orogel, NSAIDs, tylenol and cover for infection with penicillin. Offered dental block which patient initially agreed to then later declined, preferring to go to her dentist instead.     Audree CamelScott T Ennifer Harston, MD 02/01/14 [phone]

## 2014-02-01 NOTE — Discharge Instructions (Signed)
Take tylenol and/or ibuprofen every 4-6 hours as directed by the bottle. No more than 4000 mg of tylenol per day. Take your antibiotics as instructed and finish them, even if you start feeling better. Follow up with your dentist as soon as possible. There are more dentists in the resource guide provided if you can't get into your dentist soon.   Dental Pain A tooth ache may be caused by cavities (tooth decay). Cavities expose the nerve of the tooth to air and hot or cold temperatures. It may come from an infection or abscess (also called a boil or furuncle) around your tooth. It is also often caused by dental caries (tooth decay). This causes the pain you are having. DIAGNOSIS  Your caregiver can diagnose this problem by exam. TREATMENT   If caused by an infection, it may be treated with medications which kill germs (antibiotics) and pain medications as prescribed by your caregiver. Take medications as directed.  Only take over-the-counter or prescription medicines for pain, discomfort, or fever as directed by your caregiver.  Whether the tooth ache today is caused by infection or dental disease, you should see your dentist as soon as possible for further care. SEEK MEDICAL CARE IF: The exam and treatment you received today has been provided on an emergency basis only. This is not a substitute for complete medical or dental care. If your problem worsens or new problems (symptoms) appear, and you are unable to meet with your dentist, call or return to this location. SEEK IMMEDIATE MEDICAL CARE IF:   You have a fever.  You develop redness and swelling of your face, jaw, or neck.  You are unable to open your mouth.  You have severe pain uncontrolled by pain medicine. MAKE SURE YOU:   Understand these instructions.  Will watch your condition.  Will get help right away if you are not doing well or get worse. Document Released: 10/24/2005 Document Revised: 01/16/2012 Document Reviewed:  06/11/2008 [facility] Patient Information 2014 [place], [place].    Emergency Department Resource Guide 1) Find a Doctor and Pay Out of Pocket Although you won't have to find out who is covered by your insurance plan, it is a good idea to ask around and get recommendations. You will then need to call the office and see if the doctor you have chosen will accept you as a new patient and what types of options they offer for patients who are self-pay. Some doctors offer discounts or will set up payment plans for their patients who do not have insurance, but you will need to ask so you aren't surprised when you get to your appointment.  2) Contact Your Local Health Department Not all health departments have doctors that can see patients for sick visits, but many do, so it is worth a call to see if yours does. If you don't know where your local health department is, you can check in your phone book. The CDC also has a tool to help you locate your state's health department, and many state websites also have listings of all of their local health departments.  3) Find a Walk-in Clinic If your illness is not likely to be very severe or complicated, you may want to try a walk in clinic. These are popping up all over the country in pharmacies, drugstores, and shopping centers. They're usually staffed by nurse practitioners or physician assistants that have been trained to treat common illnesses and complaints. They're usually fairly quick and inexpensive. However, if  you have serious medical issues or chronic medical problems, these are probably not your best option.  No Primary Care Doctor: - Call Health Connect at  [phone] - they can help you locate a primary care doctor that  accepts your insurance, provides certain services, etc. - Physician Referral Service- [phone]  Chronic Pain Problems: Organization         Address  Phone   Notes  [facility]  [phone] Patients need to  be referred by their primary care doctor.   Medication Assistance: Organization         Address  Phone   Notes  [facility] [address] [place], [place] [address] [phone] --Must be a resident of [facility] [place] -- Must have NO insurance coverage whatsoever (no Medicaid/ Medicare, etc.) -- The pt. MUST have a primary care doctor that directs their care regularly and follows them in the community   MedAssist  [phone]   [employer]  [phone]    Agencies that provide inexpensive medical care: Organization         Address  Phone   Notes  [facility]  [phone]) [phone]   [facility]    [phone]) [phone]   [facility] [address] [place], [place] [address] [phone]) [phone]   [facility] [address], [place] [phone]   [facility]anned Parenthood    [phone](336) [phone]   [facility]    [phone]   [facility]  [address], [place] Phone:  [phone]) [phone], Fax:  [phone] Hours of Operation:  9 am - 6 pm, M-F.  Also accepts Medicaid/Medicare and self-pay.  [facility]  [address], [place] Phone: [phone](336) [phone], Fax: [phone]. Hours of Operation:  8:30 am - 5:30 pm, M-F.  Also accepts Medicaid and self-pay.  [facility] [place] [address], [place] Phone: [phone](336) [phone]   [facility] [address], [place], [place] [phone], Ext. [phone] Mondays & Thursdays: 7-9 AM.  First 15 patients are seen on a first come, first serve basis.    Medicaid-accepting [facility] Providers:  Organization         Address  Phone   Notes  [facility] [address], [place] [phone] Also accepts self-pay patients.  [facility] [address], [place]  [phone]) [phone]   [facility] [address], [place] [phone]   [facility] [address] [place] [address], [place] [phone]) [phone]   Renaye RakersVeita Bland [address], [place]   [phone]) [phone] Only accepts [place] Access [place] patients after they have their name applied to their card.   Self-Pay (no insurance) in [facility]:  Organization         Address  Phone   Notes  Sickle Cell Patients, [facility] [address], [place] [phone]   [facility] [address], [place] [phone]   [facility] [place]  [address], [place] [phone]   [facility]/Dr. Osei-Bonsu  [address] [place] [address], [place] or [phone], [place] [phone] Phone number for both [facility] [place] [place] and  [place] locations is the same.  [facility] [address], [place] [phone]   [facility] [address] [place] or [address] [phone]   [facility] [address], [place] [phone], phone; [phone], fax Sees patients 1st and 3rd Saturday of every month.  Must not qualify for public or private insurance (i.e. Medicaid, Medicare, [place] Health Choice, Veterans' Benefits)  Household income should be no more than 200% of the poverty level The clinic cannot treat you if you are pregnant or think you are pregnant  Sexually transmitted diseases are not treated at the clinic.    Dental Care: Organization         Address  Phone  Notes  [facility] of [facility] [address] [place], [place] [phone] Accepts children up to age 49 who are enrolled in [place] or [place] Health Choice; pregnant women with a Medicaid card; and children who have applied for Medicaid or [place] Health Choice, but were declined, whose parents can  pay a reduced fee at time of service.  [facility] Department of [facility]  [address], [place] [phone] Accepts children up to age 82 who are enrolled in [place] or [place] Health Choice; pregnant women with a Medicaid card; and children who have applied for Medicaid or  Health Choice, but were declined, whose parents can pay a reduced fee at time of service.  [facility]  [address] [place], [place] [phone] Patients are seen by appointment only. Walk-ins are not accepted. [facility] will see patients 14 years of age and older. Monday - Tuesday (8am-5pm) Most Wednesdays (8:30-5pm) $30 per visit, cash only  [facility] Adult Dental Access PROGRAM  [address], [facility] [phone] Patients are seen by appointment only. Walk-ins are not accepted. [facility] will see patients 75 years of age and older. One Wednesday Evening (Monthly: Volunteer Based).  $30 per visit, cash only  [employer]  [phone] for adults; Children under age 61, call Graduate Pediatric Dentistry at [phone]. Children aged 61-14, please call [phone] to request a pediatric application.  Dental services are provided in all areas of dental care including fillings, crowns and bridges, complete and partial dentures, implants, gum treatment, root canals, and extractions. Preventive care is also provided. Treatment is provided to both adults and children. Patients are selected via a lottery and there is often a waiting list.   [facility] [address], [place]  [phone] [email or web address]   [facility] [address] [place], [place] [phone], Ext. [phone] Second and Fourth Thursday of each month, opens at 6:30 AM; Clinic ends at 9 AM.  Patients are seen on a first-come first-served basis, and a limited number are seen during each clinic.   [facility]  [address] [place], [place] [phone]   Eligibility Requirements You must have lived in [place], [place], or [place] counties for at least the last three months.   You cannot be eligible for state or federal sponsored [place], including [employer], [place], or [employer].   You generally cannot be eligible for healthcare insurance through your employer.    How to apply: Eligibility screenings are held every Tuesday and Wednesday afternoon from 1:00 pm until 4:00 pm. You do not  need an appointment for the interview!  [facility] [address], [place], [place] [phone]   [facility] Health Department  [phone]   [facility] Health Department  [phone]   [facility] Health Department  [phone]    Behavioral Health Resources in the Community: Intensive Outpatient Programs Organization         Address  Phone  Notes  [facility] [address] [place], [place] [phone]   [facility]patient [address], [place], [place] [phone]   [facility] [address] [place], [place]  [phone]   [facility] [address],  [place], [place] [phone] or [phone]   Substance Abuse Resources Organization         Address  Phone  Notes  [facility] Drug Services  [phone]   [facility]  [phone]   [facility]  [phone]   Floydene Flock  [phone]   Residential & Outpatient Substance Abuse Program  [phone]   Psychological Services Organization         Address  Phone  Notes  [facility]  [phone]   [facility]  [phone]   [facility] [address], [place] [phone] or [phone]    Mobile Crisis Teams Organization         Address  Phone  Notes  [facility], Mobile Crisis Care Unit  [phone]    [facility]  [address] [place], [place] [phone]   Doristine Locks [address] [place] [phone]    Self-Help/Support Groups Organization         Address  Phone             Notes  [facility] [place] - variety of support groups  336- I7437963 Call for more information  Narcotics Anonymous (NA), [facility] [address], [employer] [place]  2 meetings at this location   Statistician         Address  Phone  Notes  [facility] [address],    [place]  [phone]   [facility]  [address], [place] [address], [place], [place] [phone]   [facility] [address] [place], [place] [phone] Admissions: 8am-3pm M-F  [facility] [address]    [place], [place] [phone]   [facility] [address] [place], [place], [place] [phone]   [facility] [address],  [place], [place] [phone]   [facility] - Intensive Outpatient [address], [place], [place] [phone]   [facility] ([facility] [address] [place].,  [place], [place] [phone] or [phone]   [facility] ([facility]) [address] [place], [place] [phone] Accepts Medicaid  [facility] [place] [address]  [place] [phone] Substance Abuse/Addiction Treatment   [facility] Organization         Address  Phone  Notes  [facility]  [phone]   Angie Fava, PhD [address] [place] [place], [place]   [phone] or [phone]   [facility]   [address] [place], [place] [phone]   [facility] [address], [place], [place] [phone] Insurance/Medicaid/sponsorship through [employer] and Families [address]  [place], [place] [phone] Therapy/tele-psych/case  °[facility] [address]  ° [place], [place] [phone]    °Dr. Arfeen  [phone]   °[facility] [place]  [employer] [place] [facility] 1) [address], [place] °2) [address], [place] °3)  [address], [place] [phone]) [phone] °([phone]) [phone] ° °([phone]) [phone]   °[place] Child Abuse Hotline (336) 342-1394 or (336) [phone] (After Hours)    ° °  °

## 2014-03-12 ENCOUNTER — Ambulatory Visit: Payer: Medicaid Other | Admitting: Diagnostic Neuroimaging

## 2014-03-21 ENCOUNTER — Ambulatory Visit: Payer: Self-pay | Admitting: Diagnostic Neuroimaging

## 2014-05-17 ENCOUNTER — Emergency Department (INDEPENDENT_AMBULATORY_CARE_PROVIDER_SITE_OTHER)
Admission: EM | Admit: 2014-05-17 | Discharge: 2014-05-17 | Disposition: A | Discharge status: Home / Self Care | Payer: Medicaid Other | Source: Home / Self Care | Attending: Emergency Medicine | Admitting: Emergency Medicine

## 2014-05-17 ENCOUNTER — Encounter (HOSPITAL_COMMUNITY): Payer: Self-pay | Admitting: Emergency Medicine

## 2014-05-17 DIAGNOSIS — G43009 Migraine without aura, not intractable, without status migrainosus: Secondary | ICD-10-CM

## 2014-05-17 MED ORDER — METOCLOPRAMIDE HCL 5 MG/ML IJ SOLN
10.0000 mg | Freq: Once | INTRAMUSCULAR | Status: AC
  Administered 2014-05-17: 10 mg via INTRAMUSCULAR

## 2014-05-17 MED ORDER — PREDNISONE 20 MG PO TABS
20.0000 mg | ORAL_TABLET | Freq: Two times a day (BID) | ORAL | Status: DC
Start: 2014-05-17 — End: 2015-12-23

## 2014-05-17 MED ORDER — KETOROLAC TROMETHAMINE 30 MG/ML IJ SOLN
INTRAMUSCULAR | Status: AC
  Filled 2014-05-17: qty 1

## 2014-05-17 MED ORDER — SUMATRIPTAN SUCCINATE 6 MG/0.5ML ~~LOC~~ SOLN
6.0000 mg | Freq: Once | SUBCUTANEOUS | Status: AC
Start: 2014-05-17 — End: 2014-05-17
  Administered 2014-05-17: 6 mg via SUBCUTANEOUS

## 2014-05-17 MED ORDER — SUMATRIPTAN SUCCINATE 6 MG/0.5ML ~~LOC~~ SOLN
SUBCUTANEOUS | Status: AC
  Filled 2014-05-17: qty 0.5

## 2014-05-17 MED ORDER — METOCLOPRAMIDE HCL 5 MG/ML IJ SOLN
INTRAMUSCULAR | Status: AC
  Filled 2014-05-17: qty 2

## 2014-05-17 MED ORDER — SUMATRIPTAN SUCCINATE 50 MG PO TABS
ORAL_TABLET | ORAL | Status: DC
Start: 2014-05-17 — End: 2016-03-17

## 2014-05-17 MED ORDER — KETOROLAC TROMETHAMINE 60 MG/2ML IM SOLN
30.0000 mg | Freq: Once | INTRAMUSCULAR | Status: AC
  Administered 2014-05-17: 30 mg via INTRAMUSCULAR

## 2014-05-17 NOTE — ED Provider Notes (Signed)
Chief Complaint   Chief Complaint  Patient presents with  . Headache    History of Present Illness   Veronica Blackwell is  A 33 year old female who has had a history of migraine headaches since she was a child. She usually can control these with BC powders, but she's been taking these over the past several days without relief and has been causing some stomach irritation. The current headache began 2 days ago without any obvious trigger. It's mostly in the right temple, sometimes a sharp pain and sometimes a throbbing pain. Rated 8/10 in intensity. No nausea but does have photophobia and phonophobia. Her vision is a little bit blurry. No diplopia. She denies fevers, chills, stiff neck, nasal congestion, rhinorrhea, or sneezing. She's had no neurological symptoms such as paresthesias, weakness, or difficulty with speech or ambulation. No history of head trauma, use of anticoagulants, she's not pregnant right now, no eye pain, neck trauma, or history of clotting or bleeding disorders.  Review of Systems   Other than as noted above, the patient denies any of the following symptoms: Systemic:  No fever, chills, photophobia, or stiff neck. Eye:  No blurred vision, or diplopia. ENT:  No nasal congestion, rhinorrhea, sinus pressure or pain, or sore throat.  No jaw claudication. Neuro:  No paresthesias, loss of consciousness, seizure activity, muscle weakness, trouble with coordination or gait, trouble speaking or swallowing. Psych:  No depression, anxiety or trouble sleeping.  PMFSH   Past medical history, family history, social history, meds, and allergies were reviewed.  Her only medication is Valtrex.  Physical Examination    Vital signs:  BP 140/92  Pulse 69  Temp(Src) 98.5 F (36.9 C) (Oral)  Resp 16  SpO2 98% General:  Alert and oriented.  In no distress. Eye:  Lids and conjunctivas normal.  PERRL,  Full EOMs.  Fundi benign with normal discs and vessels. There was no papilledema.   ENT:  No cranial or facial tenderness to palpation.  TMs and canals clear.  Nasal mucosa was normal and uncongested without any drainage. No intra oral lesions, pharynx clear, mucous membranes moist, dentition normal. Neck:  Supple, full ROM, no tenderness to palpation.  No adenopathy or mass. Neuro:  Alert and orented times 3.  Speech was clear, fluent, and appropriate.  Cranial nerves intact. No pronator drift, muscle strength normal. Finger to nose normal.  DTRs were 2+ and symmetrical.Station and gait were normal.  Romberg's sign was normal.  Able to perform tandem gait well. Psych:  Normal affect.  Course in Urgent Care Center     The patient was given the following meds:   Medications  ketorolac (TORADOL) injection 30 mg (30 mg Intramuscular Given 05/17/14 1648)  metoCLOPramide (REGLAN) injection 10 mg (10 mg Intramuscular Given 05/17/14 1648)  SUMAtriptan (IMITREX) injection 6 mg (6 mg Subcutaneous Given 05/17/14 1649)  .   Assessment   The encounter diagnosis was Migraine without aura and without status migrainosus, not intractable.  There is no evidence of subarachnoid hemorrhage, meningitis, encephalitis, subdural hematoma, epidural hematoma, acute glaucoma, carotid dissection, temporal arteritis, cavernous sinus thrombosis, carbon monoxide toxicity, or pseudotumor cerebri.    Plan   1.  Meds:  The following meds were prescribed:   Discharge Medication List as of 05/17/2014  4:43 PM    START taking these medications   Details  predniSONE (DELTASONE) 20 MG tablet Take 1 tablet (20 mg total) by mouth 2 (two) times daily., Starting 05/17/2014, Until Discontinued, Normal  SUMAtriptan (IMITREX) 50 MG tablet Take 1 at onset of headache and 1 additional pill 2 hours later if needed, Normal        2.  Patient Education/Counseling:  The patient was given appropriate handouts, self care instructions, and instructed in symptomatic relief.    3.  Follow up:  The patient was told to  follow up here if no better in 2 to 3 days, or sooner if becoming worse in any way, and given some red flag symptoms such as fever, worsening pain, persistent vomiting, or new neurological symptoms which would prompt immediate return.      Reuben Likesavid C Genine Beckett, MD 05/17/14 [id number]

## 2014-05-17 NOTE — Discharge Instructions (Signed)
Migraine Headache A migraine headache is an intense, throbbing pain on one or both sides of your head. A migraine can last for 30 minutes to several hours. CAUSES  The exact cause of a migraine headache is not always known. However, a migraine may be caused when nerves in the brain become irritated and release chemicals that cause inflammation. This causes pain. Certain things may also trigger migraines, such as:  Alcohol.  Smoking.  Stress.  Menstruation.  Aged cheeses.  Foods or drinks that contain nitrates, glutamate, aspartame, or tyramine.  Lack of sleep.  Chocolate.  Caffeine.  Hunger.  Physical exertion.  Fatigue.  Medicines used to treat chest pain (nitroglycerine), birth control pills, estrogen, and some blood pressure medicines. SIGNS AND SYMPTOMS  Pain on one or both sides of your head.  Pulsating or throbbing pain.  Severe pain that prevents daily activities.  Pain that is aggravated by any physical activity.  Nausea, vomiting, or both.  Dizziness.  Pain with exposure to bright lights, loud noises, or activity.  General sensitivity to bright lights, loud noises, or smells. Before you get a migraine, you may get warning signs that a migraine is coming (aura). An aura may include:  Seeing flashing lights.  Seeing bright spots, halos, or zig-zag lines.  Having tunnel vision or blurred vision.  Having feelings of numbness or tingling.  Having trouble talking.  Having muscle weakness. DIAGNOSIS  A migraine headache is often diagnosed based on:  Symptoms.  Physical exam.  A CT scan or MRI of your head. These imaging tests cannot diagnose migraines, but they can help rule out other causes of headaches. TREATMENT Medicines may be given for pain and nausea. Medicines can also be given to help prevent recurrent migraines.  HOME CARE INSTRUCTIONS  Only take over-the-counter or prescription medicines for pain or discomfort as directed by your  health care provider. The use of long-term narcotics is not recommended.  Lie down in a dark, quiet room when you have a migraine.  Keep a journal to find out what may trigger your migraine headaches. For example, write down:  What you eat and drink.  How much sleep you get.  Any change to your diet or medicines.  Limit alcohol consumption.  Quit smoking if you smoke.  Get 7-9 hours of sleep, or as recommended by your health care provider.  Limit stress.  Keep lights dim if bright lights bother you and make your migraines worse. SEEK IMMEDIATE MEDICAL CARE IF:   Your migraine becomes severe.  You have a fever.  You have a stiff neck.  You have vision loss.  You have muscular weakness or loss of muscle control.  You start losing your balance or have trouble walking.  You feel faint or pass out.  You have severe symptoms that are different from your first symptoms. MAKE SURE YOU:   Understand these instructions.  Will watch your condition.  Will get help right away if you are not doing well or get worse. Document Released: 10/24/2005 Document Revised: 08/14/2013 Document Reviewed: 07/01/2013 [facility] Patient Information 2015 [place], [place]. This information is not intended to replace advice given to you by your health care provider. Make sure you discuss any questions you have with your health care provider.  Blood pressure over the ideal can put you at higher risk for stroke, heart disease, and kidney failure.  For this reason, it's important to try to get your blood pressure as close as possible to the ideal.  The ideal blood pressure is 120/80.  Blood pressures from 120-139 systolic over 80-89 diastolic are labeled as "prehypertension."  This means you are at higher risk of developing hypertension in the future.  Blood pressures in this range are not treated with medication, but lifestyle changes are recommended to prevent progression to hypertension.  Blood  pressures of 140 and above systolic over 90 and above diastolic are classified as hypertension and are treated with medications.  Lifestyle changes which can benefit both prehypertension and hypertension include the following:   Salt and sodium restriction.  Weight loss.  Regular exercise.  Avoidance of tobacco.  Avoidance of excess alcohol.  The "D.A.S.H" diet.   People with hypertension and prehypertension should limit their salt intake to less than 1500 mg daily.  Reading the nutrition information on the label of many prepared foods can give you an idea of how much sodium you're consuming at each meal.  Remember that the most important number on the nutrition information is the serving size.  It may be smaller than you think.  Try to avoid adding extra salt at the table.  You may add small amounts of salt while cooking.  Remember that salt is an acquired taste and you may get used to a using a whole lot less salt than you are using now.  Using less salt lets the food's natural flavors come through.  You might want to consider using salt substitutes, potassium chloride, pepper, or blends of herbs and spices to enhance the flavor of your food.  Foods that contain the most salt include: processed meats (like ham, bacon, lunch meat, sausage, hot dogs, and breakfast meat), chips, pretzels, salted nuts, soups, salty snacks, canned foods, junk food, fast food, restaurant food, mustard, pickles, pizza, popcorn, soy sauce, and worcestershire sauce--quite a list!  You might ask, "Is there anything I can eat?"  The answer is, "yes."  Fruits and vegetables are usually low in salt.  Fresh is better than frozen which is better than canned.  If you have canned vegetables, you can cut down on the salt content by rinsing them in tap water 3 times before cooking.     Weight loss is the second thing you can do to lower your blood pressure.  Getting to and maintaining ideal weight will often normalize your blood  pressure and allow you to avoid medications, entirely, cut way down on your dosage of medications, or allow to wean off your meds.  (Note, this should only be done under the supervision of your primary care doctor.)  Of course, weight loss takes time and you may need to be on medication in the meantime.  You shoot for a body mass index of 20-25.  When you go to the urgent care or to your primary care doctor, they should calculate your BMI.  If you don't know what it is, ask.  You can calculate your BMI with the following formula:  Weight in pounds x 703/ (height in inches) x (height in inches).  There are many good diets out there: Weight Watchers and the D.A.S.H. Diet are the best, but often, just modifying a few factors can be helpful:  Don't skip meals, don't eat out, and keeping a food diary.  I do not recommend fad diets or diet pills which often raise blood pressure.    Everyone should get regular exercise, but this is particularly important for people with high blood pressure.  Just about any exercise is good.  The  only exercise which may be harmful is lifting extreme heavy weights.  I recommend moderate exercise such as walking for 30 minutes 5 days a week.  Going to the gym for a 50 minute workout 3 times a week is also good.  This amounts to 150 minutes of exercise weekly.   Anyone with high blood pressure should avoid any use of tobacco.  Tobacco use does not elevate blood pressure, but it increases the risk of heart disease and stroke.  If you are interested in quitting, discuss with your doctor how to quit.  If you are not interested in quitting, ask yourself, "What would my life be like in 10 years if I continue to smoke?"  "How will I know when it is time to quit?"  "How would my life be better if I were to quit."   Excess alcohol intake can raise the blood pressure.  The safe alcohol intake is 2 drinks or less per day for men and 1 drink per day or less for women.   There is a very good  diet which I recommend that has been designed for people with blood pressure called the D.A.S.H. Diet (dietary approaches to stop hypertension).  It consists of fruits, vegetables, lean meats, low fat dairy, whole grains, nuts and seeds.  It is very low in salt and sodium.  It has also been found to have other beneficial health effects such as lowering cholesterol and helping lose weight.  It has been developed by the [employer] and can be downloaded from the internet without any cost. Just do a Programmer, multimediaweb search on "D.A.S.H. Diet." or go the NIH website (GolfingPosters.tnwww.nih.gov).  There are also cookbooks and diet plans that can be gotten from [employer] to help you with this diet.  DASH Eating Plan DASH stands for "Dietary Approaches to Stop Hypertension." The DASH eating plan is a healthy eating plan that has been shown to reduce high blood pressure (hypertension). Additional health benefits may include reducing the risk of type 2 diabetes mellitus, heart disease, and stroke. The DASH eating plan may also help with weight loss. WHAT DO I NEED TO KNOW ABOUT THE DASH EATING PLAN? For the DASH eating plan, you will follow these general guidelines:  Choose foods with a percent daily value for sodium of less than 5% (as listed on the food label).  Use salt-free seasonings or herbs instead of table salt or sea salt.  Check with your health care provider or pharmacist before using salt substitutes.  Eat lower-sodium products, often labeled as "lower sodium" or "no salt added."  Eat fresh foods.  Eat more vegetables, fruits, and low-fat dairy products.  Choose whole grains. Look for the word "whole" as the first word in the ingredient list.  Choose fish and skinless chicken or [place] more often than red meat. Limit fish, poultry, and meat to 6 oz (170 g) each day.  Limit sweets, desserts, sugars, and sugary drinks.  Choose heart-healthy fats.  Limit cheese to 1 oz (28 g) per day.  Eat more  home-cooked food and less restaurant, buffet, and fast food.  Limit fried foods.  Cook foods using methods other than frying.  Limit canned vegetables. If you do use them, rinse them well to decrease the sodium.  When eating at a restaurant, ask that your food be prepared with less salt, or no salt if possible. WHAT FOODS CAN I EAT? Seek help from a dietitian for individual calorie needs. Grains Whole  grain or whole wheat bread. Brown rice. Whole grain or whole wheat pasta. Quinoa, bulgur, and whole grain cereals. Low-sodium cereals. Corn or whole wheat flour tortillas. Whole grain cornbread. Whole grain crackers. Low-sodium crackers. Vegetables Fresh or frozen vegetables (raw, steamed, roasted, or grilled). Low-sodium or reduced-sodium tomato and vegetable juices. Low-sodium or reduced-sodium tomato sauce and paste. Low-sodium or reduced-sodium canned vegetables.  Fruits All fresh, canned (in natural juice), or frozen fruits. Meat and Other Protein Products Ground beef (85% or leaner), grass-fed beef, or beef trimmed of fat. Skinless chicken or [place]. Ground chicken or [place]. Pork trimmed of fat. All fish and seafood. Eggs. Dried beans, peas, or lentils. Unsalted nuts and seeds. Unsalted canned beans. Dairy Low-fat dairy products, such as skim or 1% milk, 2% or reduced-fat cheeses, low-fat ricotta or cottage cheese, or plain low-fat yogurt. Low-sodium or reduced-sodium cheeses. Fats and Oils Tub margarines without trans fats. Light or reduced-fat mayonnaise and salad dressings (reduced sodium). Avocado. Safflower, olive, or canola oils. Natural peanut or almond butter. Other Unsalted popcorn and pretzels. The items listed above may not be a complete list of recommended foods or beverages. Contact your dietitian for more options. WHAT FOODS ARE NOT RECOMMENDED? Grains White bread. White pasta. White rice. Refined cornbread. Bagels and croissants. Crackers that contain trans  fat. Vegetables Creamed or fried vegetables. Vegetables in a cheese sauce. Regular canned vegetables. Regular canned tomato sauce and paste. Regular tomato and vegetable juices. Fruits Dried fruits. Canned fruit in light or heavy syrup. Fruit juice. Meat and Other Protein Products Fatty cuts of meat. Ribs, chicken wings, bacon, sausage, bologna, salami, chitterlings, fatback, hot dogs, bratwurst, and packaged luncheon meats. Salted nuts and seeds. Canned beans with salt. Dairy Whole or 2% milk, cream, half-and-half, and cream cheese. Whole-fat or sweetened yogurt. Full-fat cheeses or blue cheese. Nondairy creamers and whipped toppings. Processed cheese, cheese spreads, or cheese curds. Condiments Onion and garlic salt, seasoned salt, table salt, and sea salt. Canned and packaged gravies. Worcestershire sauce. Tartar sauce. Barbecue sauce. Teriyaki sauce. Soy sauce, including reduced sodium. Steak sauce. Fish sauce. Oyster sauce. Cocktail sauce. Horseradish. Ketchup and mustard. Meat flavorings and tenderizers. Bouillon cubes. Hot sauce. Tabasco sauce. Marinades. Taco seasonings. Relishes. Fats and Oils Butter, stick margarine, lard, shortening, ghee, and bacon fat. Coconut, palm kernel, or palm oils. Regular salad dressings. Other Pickles and olives. Salted popcorn and pretzels. The items listed above may not be a complete list of foods and beverages to avoid. Contact your dietitian for more information. WHERE CAN I FIND MORE INFORMATION? National Heart, Lung, and Blood Institute: CablePromo.it Document Released: 10/13/2011 Document Revised: 10/29/2013 Document Reviewed: 08/28/2013 [facility] Patient Information 2015 [place], [place]. This information is not intended to replace advice given to you by your health care provider. Make sure you discuss any questions you have with your health care provider.

## 2014-05-17 NOTE — ED Notes (Signed)
Patient states has a really bad headache that started yesterday Was using OTC goodies with no relief

## 2015-09-23 ENCOUNTER — Emergency Department (INDEPENDENT_AMBULATORY_CARE_PROVIDER_SITE_OTHER)
Admission: EM | Admit: 2015-09-23 | Discharge: 2015-09-23 | Disposition: A | Discharge status: Home / Self Care | Payer: Medicaid Other | Source: Home / Self Care | Attending: Family Medicine | Admitting: Family Medicine

## 2015-09-23 ENCOUNTER — Encounter (HOSPITAL_COMMUNITY): Payer: Self-pay | Admitting: *Deleted

## 2015-09-23 DIAGNOSIS — Z3201 Encounter for pregnancy test, result positive: Secondary | ICD-10-CM

## 2015-09-23 DIAGNOSIS — Z331 Pregnant state, incidental: Secondary | ICD-10-CM

## 2015-09-23 LAB — POCT URINALYSIS DIP (DEVICE)
GLUCOSE, UA: NEGATIVE mg/dL
LEUKOCYTES UA: NEGATIVE
Nitrite: NEGATIVE
PROTEIN: NEGATIVE mg/dL
Urobilinogen, UA: 0.2 mg/dL (ref 0.0–1.0)
pH: 5.5 (ref 5.0–8.0)

## 2015-09-23 LAB — POCT PREGNANCY, URINE: Preg Test, Ur: POSITIVE — AB

## 2015-09-23 NOTE — ED Notes (Signed)
Pt  Thinks  She  May  Be  Pregnant   She  Is  Late  On her  Period  Has  Nausea  And  Lower  abd  Feels  Tight     She  Took a  Home  preg test  Which  Was  Positive

## 2015-09-23 NOTE — ED Provider Notes (Signed)
CSN: [id number]     Arrival date & time 09/23/15  1445 History   First MD Initiated Contact with Patient 09/23/15 1541     Chief Complaint  Patient presents with  . Nausea   (Consider location/radiation/quality/duration/timing/severity/associated sxs/prior Treatment) Patient is a 33 y.o. female presenting with female genitourinary complaint. The history is provided by the patient.  Female GU Problem This is a new problem. The current episode started 2 days ago. The problem has not changed (G3P2(sons).had nexplanon removed in jan.) since onset.Associated symptoms include abdominal pain. Associated symptoms comments: lmp 10/9 , no bcp, sx of nausea and feels tight, no bleeding..    Past Medical History  Diagnosis Date  . Migraine    Past Surgical History  Procedure Laterality Date  . Artery repair      L wrist  . Foot surgery      Right  . Cesarean section     History reviewed. No pertinent family history. Social History  Substance Use Topics  . Smoking status: Former Games developer  . Smokeless tobacco: Never Used  . Alcohol Use: Yes     Comment: Social   OB History    No data available     Review of Systems  Constitutional: Negative.   Gastrointestinal: Positive for nausea and abdominal pain. Negative for vomiting, diarrhea and constipation.  Genitourinary: Positive for menstrual problem and pelvic pain. Negative for urgency and vaginal bleeding.  Musculoskeletal: Negative.   All other systems reviewed and are negative.   Allergies  Latex  Home Medications   Prior to Admission medications   Medication Sig Start Date End Date Taking? Authorizing Provider  Aspirin-Acetaminophen-Caffeine (GOODY HEADACHE PO) Take 2-4 packets by mouth 3 (three) times daily as needed (headaches).     Historical Provider, MD  ibuprofen (ADVIL,MOTRIN) 200 MG tablet Take 800 mg by mouth every 8 (eight) hours as needed for pain.    Historical Provider, MD  penicillin v potassium (VEETID) 500 MG  tablet Take 2 tablets (1,000 mg total) by mouth 2 (two) times daily. X 7 days 02/01/14   Pricilla Loveless, MD  predniSONE (DELTASONE) 20 MG tablet Take 1 tablet (20 mg total) by mouth 2 (two) times daily. 05/17/14   Reuben Likes, MD  SUMAtriptan (IMITREX) 50 MG tablet Take 1 at onset of headache and 1 additional pill 2 hours later if needed 05/17/14   Reuben Likes, MD  valACYclovir (VALTREX) 500 MG tablet Take 500 mg by mouth daily.    Historical Provider, MD   Meds Ordered and Administered this Visit  Medications - No data to display  BP 124/80 mmHg  Pulse 78  Temp(Src) 98.6 F (37 C) (Oral)  Resp 18  SpO2 100%  LMP 08/17/2015 No data found.   Physical Exam  Constitutional: She is oriented to person, place, and time. She appears well-developed and well-nourished.  Abdominal: Soft. Bowel sounds are normal. She exhibits no distension and no mass. There is no tenderness. There is no rebound and no guarding.  Neurological: She is alert and oriented to person, place, and time.  Skin: Skin is warm and dry.  Nursing note and vitals reviewed.   ED Course  Procedures (including critical care time)  Labs Review Labs Reviewed  POCT URINALYSIS DIP (DEVICE) - Abnormal; Notable for the following:    Bilirubin Urine SMALL (*)    Ketones, ur TRACE (*)    Hgb urine dipstick TRACE (*)    All other components within normal limits  POCT PREGNANCY, URINE - Abnormal; Notable for the following:    Preg Test, Ur POSITIVE (*)    All other components within normal limits    Imaging Review No results found.   Visual Acuity Review  Right Eye Distance:   Left Eye Distance:   Bilateral Distance:    Right Eye Near:   Left Eye Near:    Bilateral Near:         MDM   1. Pregnancy as incidental finding        Linna HoffJames D Daymein Nunnery, MD 09/23/15 [id number]

## 2015-09-23 NOTE — Discharge Instructions (Signed)
See dr Gavin Pottersgrandis for prenatal care.

## 2015-11-05 ENCOUNTER — Encounter: Payer: Self-pay | Admitting: Obstetrics & Gynecology

## 2015-11-05 ENCOUNTER — Encounter: Payer: Medicaid Other | Admitting: Family Medicine

## 2015-11-08 NOTE — L&D Delivery Note (Signed)
Delivery Note   Requested by Dr. Vergie LivingPickens to attend this repeat C-section delivery at 38 5/[redacted] weeks GA due to PROM.   [facility] G3P2[facility]  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   Intrapartum course was uncomplicated. Mom is on valtrex for genital HSV. ROM occurred at 0500 on 7/17 with clear fluid.   Infant vigorous with good spontaneous cry.  Routine NRP followed including warming, drying and stimulation.  Apgars 9 / 9.  Physical exam within normal limits.   Left in OR for skin-to-skin contact with mother, in care of CN staff.  Care transferred to Pediatrician.  Lyndsy Gilberto T, RN, NNP-BC

## 2015-11-16 LAB — OB RESULTS CONSOLE ABO/RH: RH Type: POSITIVE

## 2015-11-16 LAB — OB RESULTS CONSOLE HGB/HCT, BLOOD
HCT: 40 %
HEMOGLOBIN: 13.2 g/dL

## 2015-11-16 LAB — OB RESULTS CONSOLE HIV ANTIBODY (ROUTINE TESTING): HIV: NONREACTIVE

## 2015-11-16 LAB — OB RESULTS CONSOLE RUBELLA ANTIBODY, IGM: RUBELLA: IMMUNE

## 2015-11-16 LAB — OB RESULTS CONSOLE GC/CHLAMYDIA
CHLAMYDIA, DNA PROBE: NEGATIVE
GC PROBE AMP, GENITAL: NEGATIVE

## 2015-11-16 LAB — OB RESULTS CONSOLE RPR: RPR: NONREACTIVE

## 2015-11-16 LAB — OB RESULTS CONSOLE ANTIBODY SCREEN: ANTIBODY SCREEN: NEGATIVE

## 2015-11-16 LAB — OB RESULTS CONSOLE HEPATITIS B SURFACE ANTIGEN: HEP B S AG: NEGATIVE

## 2015-11-16 LAB — OB RESULTS CONSOLE PLATELET COUNT: Platelets: 155 10*3/uL

## 2015-11-24 DIAGNOSIS — Y230XXA Shotgun discharge, undetermined intent, initial encounter: Secondary | ICD-10-CM | POA: Insufficient documentation

## 2015-11-24 DIAGNOSIS — W3301XA Accidental discharge of shotgun, initial encounter: Secondary | ICD-10-CM | POA: Insufficient documentation

## 2015-12-23 ENCOUNTER — Encounter (HOSPITAL_COMMUNITY): Payer: Self-pay | Admitting: *Deleted

## 2015-12-23 ENCOUNTER — Inpatient Hospital Stay (HOSPITAL_COMMUNITY)
Admission: AD | Admit: 2015-12-23 | Discharge: 2015-12-23 | Disposition: A | Discharge status: Home / Self Care | Payer: Medicaid Other | Source: Ambulatory Visit | Attending: Obstetrics and Gynecology | Admitting: Obstetrics and Gynecology

## 2015-12-23 DIAGNOSIS — Z87891 Personal history of nicotine dependence: Secondary | ICD-10-CM | POA: Insufficient documentation

## 2015-12-23 DIAGNOSIS — O219 Vomiting of pregnancy, unspecified: Secondary | ICD-10-CM | POA: Diagnosis present

## 2015-12-23 DIAGNOSIS — Z3A17 17 weeks gestation of pregnancy: Secondary | ICD-10-CM | POA: Diagnosis not present

## 2015-12-23 DIAGNOSIS — R52 Pain, unspecified: Secondary | ICD-10-CM | POA: Diagnosis not present

## 2015-12-23 DIAGNOSIS — Z91040 Latex allergy status: Secondary | ICD-10-CM | POA: Insufficient documentation

## 2015-12-23 DIAGNOSIS — E860 Dehydration: Secondary | ICD-10-CM | POA: Insufficient documentation

## 2015-12-23 DIAGNOSIS — G43909 Migraine, unspecified, not intractable, without status migrainosus: Secondary | ICD-10-CM | POA: Insufficient documentation

## 2015-12-23 DIAGNOSIS — R112 Nausea with vomiting, unspecified: Secondary | ICD-10-CM

## 2015-12-23 HISTORY — DX: Herpesviral infection of urogenital system, unspecified: A60.00

## 2015-12-23 LAB — COMPREHENSIVE METABOLIC PANEL
ALBUMIN: 3.3 g/dL — AB (ref 3.5–5.0)
ALT: 13 U/L — AB (ref 14–54)
ANION GAP: 12 (ref 5–15)
AST: 23 U/L (ref 15–41)
Alkaline Phosphatase: 44 U/L (ref 38–126)
BUN: <5 mg/dL — ABNORMAL LOW (ref 6–20)
CHLORIDE: 104 mmol/L (ref 101–111)
CO2: 19 mmol/L — AB (ref 22–32)
CREATININE: 0.44 mg/dL (ref 0.44–1.00)
Calcium: 8.6 mg/dL — ABNORMAL LOW (ref 8.9–10.3)
GFR calc non Af Amer: >60 mL/min (ref >60)
Glucose, Bld: 95 mg/dL (ref 65–99)
Potassium: 3.2 mmol/L — ABNORMAL LOW (ref 3.5–5.1)
SODIUM: 135 mmol/L (ref 135–145)
Total Bilirubin: 0.6 mg/dL (ref 0.3–1.2)
Total Protein: 7 g/dL (ref 6.5–8.1)

## 2015-12-23 LAB — CBC
HCT: 32 % — ABNORMAL LOW (ref 36.0–46.0)
Hemoglobin: 11.4 g/dL — ABNORMAL LOW (ref 12.0–15.0)
MCH: 31.4 pg (ref 26.0–34.0)
MCHC: 35.6 g/dL (ref 30.0–36.0)
MCV: 88.2 fL (ref 78.0–100.0)
PLATELETS: 116 10*3/uL — AB (ref 150–400)
RBC: 3.63 MIL/uL — AB (ref 3.87–5.11)
RDW: 14.4 % (ref 11.5–15.5)
WBC: 8.2 10*3/uL (ref 4.0–10.5)

## 2015-12-23 LAB — URINALYSIS, ROUTINE W REFLEX MICROSCOPIC
Glucose, UA: NEGATIVE mg/dL
Leukocytes, UA: NEGATIVE
NITRITE: NEGATIVE
PH: 6 (ref 5.0–8.0)
PROTEIN: 30 mg/dL — AB
Specific Gravity, Urine: >1.03 — ABNORMAL HIGH (ref 1.005–1.030)

## 2015-12-23 LAB — URINE MICROSCOPIC-ADD ON

## 2015-12-23 LAB — AMYLASE: Amylase: 45 U/L (ref 28–100)

## 2015-12-23 LAB — LIPASE, BLOOD: LIPASE: 23 U/L (ref 11–51)

## 2015-12-23 MED ORDER — SODIUM CHLORIDE 0.9 % IV SOLN
25.0000 mg | Freq: Once | INTRAVENOUS | Status: AC
  Administered 2015-12-23: 25 mg via INTRAVENOUS
  Filled 2015-12-23: qty 1

## 2015-12-23 MED ORDER — DEXTROSE IN LACTATED RINGERS 5 % IV SOLN
Freq: Once | INTRAVENOUS | Status: AC
  Administered 2015-12-23: 15:00:00 via INTRAVENOUS

## 2015-12-23 MED ORDER — ONDANSETRON HCL 4 MG PO TABS: 4.0000 mg | ORAL_TABLET | Freq: Three times a day (TID) | ORAL | Status: DC | PRN

## 2015-12-23 MED ORDER — ONDANSETRON HCL 4 MG/2ML IJ SOLN
4.0000 mg | Freq: Once | INTRAMUSCULAR | Status: AC
  Administered 2015-12-23: 4 mg via INTRAVENOUS
  Filled 2015-12-23: qty 2

## 2015-12-23 MED ORDER — ACETAMINOPHEN 325 MG PO TABS: 650.0000 mg | ORAL_TABLET | ORAL | Status: AC | PRN

## 2015-12-23 NOTE — MAU Provider Note (Signed)
Veronica Blackwell is a 35 y.o. G3P2002 at 17.0 weeks, present to MAU unannounced c/o n/v and body ache fore th entire pregnancy. States has lost 20lbs since the beginning of the pregnancy.   She hasn't had anything to eat, unable to keep anything down x 2 days.   Denies fever, denies anyone in her house hold is sick. Has a slight non productive cough.     History     There are no active problems to display for this patient.   Chief Complaint  Patient presents with  . Emesis  . Generalized Body Aches   HPI  OB History    Gravida Para Term Preterm AB TAB SAB Ectopic Multiple Living   Past Medical History  Diagnosis Date  . Migraine   . Genital herpes     Past Surgical History  Procedure Laterality Date  . Artery repair      L wrist  . Foot surgery      Right  . Cesarean section      Family History  Problem Relation Age of Onset  . Diabetes Mother   . Heart disease Mother   . Kidney disease Mother   . Early death Mother 71    heart failure    Social History  Substance Use Topics  . Smoking status: Former Smoker    Types: Cigarettes  . Smokeless tobacco: Never Used  . Alcohol Use: Yes     Comment: Social, not now    Allergies:  Allergies  Allergen Reactions  . Latex Anaphylaxis    Prescriptions prior to admission  Medication Sig Dispense Refill Last Dose  . HYDROcodone-acetaminophen (NORCO/VICODIN) 5-325 MG tablet Take 2 tablets by mouth every 6 (six) hours as needed for moderate pain.   12/22/2015 at Unknown time  . Prenatal Vit-Fe Fumarate-FA (PRENATAL MULTIVITAMIN) TABS tablet Take 1 tablet by mouth daily at 12 noon.   12/21/2015  . valACYclovir (VALTREX) 500 MG tablet Take 500 mg by mouth daily.   12/22/2015 at Unknown time  . SUMAtriptan (IMITREX) 50 MG tablet Take 1 at onset of headache and 1 additional pill 2 hours later if needed (Patient not taking: Reported on 12/23/2015) 10 tablet 0     ROS See HPI above, all other systems  are negative  Physical Exam   Blood pressure 106/59, pulse 113, temperature 98.5 F (36.9 C), temperature source Oral, resp. rate 20, weight 178 lb 12.8 oz (81.103 kg), last menstrual period 08/16/2015.  Physical Exam Ext:  WNL ABD: Soft, non tender to palpation, no rebound or guarding SVE: deferred Lung clear bilaterally   ED Course  Assessment: IUP at  17.0 weeks Membranes: intact FHR: 166 CTX: none Nausea dehydrated   Plan: Labs: CBC, amylase, lipase  urine IVF zofran Phenergan Dr Stefano Gaul consulted   Detrice Cales, CNM, MSN 12/23/2015. 4:13 PM  MAU Addendum Note  Results for orders placed or performed during the hospital encounter of 12/23/15 (from the past 24 hour(s))  Urinalysis, Routine w reflex microscopic (not at [facility])     Status: Abnormal   Collection Time: 12/23/15  2:00 PM  Result Value Ref Range   Color, Urine YELLOW YELLOW   APPearance CLEAR CLEAR   Specific Gravity, Urine >1.030 (H) 1.005 - 1.030   pH 6.0 5.0 - 8.0   Glucose, UA NEGATIVE NEGATIVE mg/dL   Hgb urine dipstick MODERATE (A) NEGATIVE   Bilirubin  Urine SMALL (A) NEGATIVE   Ketones, ur >80 (A) NEGATIVE mg/dL   Protein, ur 30 (A) NEGATIVE mg/dL   Nitrite NEGATIVE NEGATIVE   Leukocytes, UA NEGATIVE NEGATIVE  Urine microscopic-add on     Status: Abnormal   Collection Time: 12/23/15  2:00 PM  Result Value Ref Range   Squamous Epithelial / LPF 0-5 (A) NONE SEEN   WBC, UA 0-5 0 - 5 WBC/hpf   RBC / HPF 0-5 0 - 5 RBC/hpf   Bacteria, UA FEW (A) NONE SEEN   Urine-Other MUCOUS PRESENT   CBC     Status: Abnormal   Collection Time: 12/23/15  2:30 PM  Result Value Ref Range   WBC 8.2 4.0 - 10.5 K/uL   RBC 3.63 (L) 3.87 - 5.11 MIL/uL   Hemoglobin 11.4 (L) 12.0 - 15.0 g/dL   HCT 16.1 (L) 09.6 - 04.5 %   MCV 88.2 78.0 - 100.0 fL   MCH 31.4 26.0 - 34.0 pg   MCHC 35.6 30.0 - 36.0 g/dL   RDW 40.9 81.1 - 91.4 %   Platelets 116 (L) 150 - 400 K/uL  Comprehensive metabolic panel     Status:  Abnormal   Collection Time: 12/23/15  2:30 PM  Result Value Ref Range   Sodium 135 135 - 145 mmol/L   Potassium 3.2 (L) 3.5 - 5.1 mmol/L   Chloride 104 101 - 111 mmol/L   CO2 19 (L) 22 - 32 mmol/L   Glucose, Bld 95 65 - 99 mg/dL   BUN <5 (L) 6 - 20 mg/dL   Creatinine, Ser 7.82 0.44 - 1.00 mg/dL   Calcium 8.6 (L) 8.9 - 10.3 mg/dL   Total Protein 7.0 6.5 - 8.1 g/dL   Albumin 3.3 (L) 3.5 - 5.0 g/dL   AST 23 15 - 41 U/L   ALT 13 (L) 14 - 54 U/L   Alkaline Phosphatase 44 38 - 126 U/L   Total Bilirubin 0.6 0.3 - 1.2 mg/dL   GFR calc non Af Amer >60 >60 mL/min   GFR calc Af Amer >60 >60 mL/min   Anion gap 12 5 - 15   Pt feels better  Plan: -Bleeding and PTL Precautions -Encouraged to call if any questions or concerns arise prior to next scheduled office visit.  -Discharged to home in stable condition -Pt to FU in the office tomorrow at 10:30am  -Ok to take tylenol -zofran PRN  Miguelangel Korn, CNM, MSN 12/23/2015. 4:57 PM

## 2015-12-23 NOTE — MAU Note (Signed)
Hasn't ate in 2 days, nothing stays down. Has lost 20# so far this preg. Body aches. Denies fever

## 2016-02-03 ENCOUNTER — Encounter: Payer: Self-pay | Admitting: *Deleted

## 2016-02-03 DIAGNOSIS — M549 Dorsalgia, unspecified: Secondary | ICD-10-CM

## 2016-02-03 DIAGNOSIS — O34219 Maternal care for unspecified type scar from previous cesarean delivery: Secondary | ICD-10-CM | POA: Insufficient documentation

## 2016-02-03 DIAGNOSIS — F1910 Other psychoactive substance abuse, uncomplicated: Secondary | ICD-10-CM | POA: Insufficient documentation

## 2016-02-03 DIAGNOSIS — A6000 Herpesviral infection of urogenital system, unspecified: Secondary | ICD-10-CM

## 2016-02-03 DIAGNOSIS — G8929 Other chronic pain: Secondary | ICD-10-CM

## 2016-02-03 DIAGNOSIS — F17210 Nicotine dependence, cigarettes, uncomplicated: Secondary | ICD-10-CM

## 2016-02-03 DIAGNOSIS — M79643 Pain in unspecified hand: Secondary | ICD-10-CM

## 2016-02-03 DIAGNOSIS — O0990 Supervision of high risk pregnancy, unspecified, unspecified trimester: Secondary | ICD-10-CM

## 2016-02-03 DIAGNOSIS — IMO0002 Reserved for concepts with insufficient information to code with codable children: Secondary | ICD-10-CM

## 2016-02-04 ENCOUNTER — Encounter: Payer: Medicaid Other | Admitting: Obstetrics & Gynecology

## 2016-02-05 ENCOUNTER — Other Ambulatory Visit (HOSPITAL_COMMUNITY): Payer: Self-pay | Admitting: Obstetrics & Gynecology

## 2016-02-05 DIAGNOSIS — O285 Abnormal chromosomal and genetic finding on antenatal screening of mother: Secondary | ICD-10-CM

## 2016-02-05 DIAGNOSIS — Z3689 Encounter for other specified antenatal screening: Secondary | ICD-10-CM

## 2016-02-05 DIAGNOSIS — Z3A23 23 weeks gestation of pregnancy: Secondary | ICD-10-CM

## 2016-02-09 ENCOUNTER — Ambulatory Visit (HOSPITAL_COMMUNITY)
Admission: RE | Admit: 2016-02-09 | Discharge: 2016-02-09 | Disposition: A | Discharge status: Home / Self Care | Payer: Medicaid Other | Source: Ambulatory Visit | Attending: Obstetrics & Gynecology | Admitting: Obstetrics & Gynecology

## 2016-02-09 ENCOUNTER — Other Ambulatory Visit (HOSPITAL_COMMUNITY): Payer: Self-pay | Admitting: Obstetrics & Gynecology

## 2016-02-09 ENCOUNTER — Encounter (HOSPITAL_COMMUNITY): Payer: Self-pay

## 2016-02-09 VITALS — BP 105/63 | HR 81 | Wt 177.6 lb

## 2016-02-09 DIAGNOSIS — Z3689 Encounter for other specified antenatal screening: Secondary | ICD-10-CM

## 2016-02-09 DIAGNOSIS — O283 Abnormal ultrasonic finding on antenatal screening of mother: Secondary | ICD-10-CM | POA: Diagnosis not present

## 2016-02-09 DIAGNOSIS — Z315 Encounter for genetic counseling: Secondary | ICD-10-CM | POA: Insufficient documentation

## 2016-02-09 DIAGNOSIS — Z3A23 23 weeks gestation of pregnancy: Secondary | ICD-10-CM | POA: Insufficient documentation

## 2016-02-09 DIAGNOSIS — O0990 Supervision of high risk pregnancy, unspecified, unspecified trimester: Secondary | ICD-10-CM

## 2016-02-09 DIAGNOSIS — O289 Unspecified abnormal findings on antenatal screening of mother: Secondary | ICD-10-CM

## 2016-02-09 DIAGNOSIS — IMO0002 Reserved for concepts with insufficient information to code with codable children: Secondary | ICD-10-CM

## 2016-02-09 DIAGNOSIS — O358XX0 Maternal care for other (suspected) fetal abnormality and damage, not applicable or unspecified: Secondary | ICD-10-CM

## 2016-02-09 DIAGNOSIS — O280 Abnormal hematological finding on antenatal screening of mother: Secondary | ICD-10-CM

## 2016-02-09 DIAGNOSIS — O99322 Drug use complicating pregnancy, second trimester: Secondary | ICD-10-CM | POA: Diagnosis not present

## 2016-02-09 DIAGNOSIS — O285 Abnormal chromosomal and genetic finding on antenatal screening of mother: Secondary | ICD-10-CM

## 2016-02-09 DIAGNOSIS — O34219 Maternal care for unspecified type scar from previous cesarean delivery: Secondary | ICD-10-CM

## 2016-02-09 DIAGNOSIS — IMO0001 Reserved for inherently not codable concepts without codable children: Secondary | ICD-10-CM

## 2016-02-09 DIAGNOSIS — O99320 Drug use complicating pregnancy, unspecified trimester: Secondary | ICD-10-CM

## 2016-02-09 NOTE — Progress Notes (Signed)
Genetic Counseling  High-Risk Gestation Note  Appointment Date:  02/09/2016 Referred By: Hoover Browns, MD Date of Birth:  05/26/1981   Pregnancy History: [id number] Estimated Date of Delivery: 06/01/16 Estimated Gestational Age: [redacted]w[redacted]d Attending: Damaris Hippo, MD   Veronica Blackwell was seen for genetic counseling because of an increased risk for fetal trisomy 18 based on Quad screening through [employer].  In Summary:  1 in 23 Trisomy 18 risk from Quad screen  Ultrasound performed today; Left mild pyelectasis (urinary tract dilation) visualized  Follow-up ultrasound scheduled for 03/23/16 to reevaluate fetal kidneys  Patient elected to have NIPS (Panorama); She declined amniocentesis  Family history significant for patient's maternal half-sister's son with congenital heart defect   Given degree of relation, recurrence risk not likely increased in case of likely multifactorial inheritance  Family history significant for maternal half-nephew to father of the pregnancy with cleft lip  Given degree of relation, recurrence risk would not be increased in case of likely multifactorial inheritance  First cousin once removed with Down syndrome  Recurrence risk would not likely be increased for the current pregnancy given reported family history  She was counseled regarding the Quad screen result and the associated 1 in 23 risk for fetal trisomy 87.  We reviewed chromosomes, nondisjunction, and the common features and poor prognosis of trisomy 55.  In addition, we reviewed the screen adjusted reduction in risks for trisomy 18 (screen negative) and ONTDs.  We also discussed other explanations for a screen positive result including: a gestational dating error, differences in maternal metabolism, and normal variation. She understands that this screening is not diagnostic for Down syndrome but provides a risk assessment.  We reviewed available screening options including noninvasive  prenatal screening (NIPS)/cell free fetal DNA (cffDNA) testing, and detailed ultrasound.  She was counseled that screening tests are used to modify a patient's a priori risk for aneuploidy, typically based on age. This estimate provides a pregnancy specific risk assessment. We reviewed the benefits and limitations of each option. Specifically, we discussed the conditions for which each test screens, the detection rates, and false positive rates of each. She was also counseled regarding diagnostic testing via amniocentesis. We reviewed the approximate 1 in 300-500 risk for complications for amniocentesis, including spontaneous pregnancy loss.   Detailed ultrasound was performed today. Left mild pyelectasis was visualized. Remaining visualized fetal anatomy within normal range. Complete ultrasound results reported separately. We discussed that fetal pyelectasis is defined as the dilatation of the fetal renal pelvis/pelvises due to excess urine. This finding is estimated to occur in 2-3% of fetuses.  The female to female ratio is 2:1.  Typically, babies with mild pyelectasis are born normal and healthy and we are usually unable to determine why this extra fluid is present.  This urine accumulation may regress, stay the same or continue to accumulate.  The more fluid that accumulates, the more likely this fluid could be the result of a compromise in kidney function, an obstruction, or narrowing of the ureters which transport urine out of the body, thus causing backflow of fluid into the kidneys.  Therefore, it is important to follow pyelectasis to make sure it does not become more concerning.  Also, in some cases postnatal evaluation of baby's kidneys may be warranted.  We discussed that the finding of pyelectasis is associated with an increased risk for fetal aneuploidy.  This risk is highest when other anomalies or fetal differences are visualized.  After consideration of all the options, she  elected to proceed with  NIPS (Panorama through [facility] laboratory).  Those results will be available in 8-10 days. Diagnostic testing was declined today.  She understands that screening tests cannot rule out all birth defects or genetic syndromes. The patient was advised of this limitation and states she still does not want additional testing at this time. Follow-up ultrasound is scheduled for 03/23/16.  Veronica Blackwell was provided with written information regarding sickle cell anemia (SCA) including the carrier frequency and incidence in the African-American population, the availability of carrier testing and prenatal diagnosis if indicated.  In addition, we discussed that hemoglobinopathies are routinely screened for as part of the [place] newborn screening panel.  Hemoglobin electrophoresis was previously performed through her OB office and was within normal range.   Both family histories were reviewed and found to be contributory for congenital heart disease for the patient's maternal half-sister's son. He reportedly had surgical correction at age 64 years old due to transposition of the great vessels. He is currently 33 years old and otherwise healthy. Congenital heart defects occur in approximately 1% of pregnancies.  Congenital heart defects may occur due to multifactorial influences, chromosomal abnormalities, genetic syndromes or environmental exposures.  Isolated heart defects are generally multifactorial.  Given the reported family history and assuming multifactorial inheritance, the risk for a congenital heart defect in the current pregnancy does not appear to be increased above the general population risk.  The patient reported that the father of the pregnancy has a nephew (his maternal half-sister's daughter) with cleft lip. We discussed that cleft lip +/- cleft palate can be syndromic or isolated.  If the patient's relative has a syndromic form of clefting, the chance of having an affected child depends on the  inheritance pattern of that condition.  If the patient's relative has an isolated form of clefting, we discussed the probable multifactorial inheritance and explained that genetic testing for isolated cleft lip +/- cleft palate is not currently available.  Based on the family history, this couple's chance to have a baby with an isolated cleft lip +/- cleft palate is not expected to be increased above the general population risk, in the case of multifactorial inheritance.   The father of the pregnancy also reportedly has a female maternal first cousin once removed with Down syndrome (unspecified type). We discussed that 95% of cases of Down syndrome are not inherited and are the result of non-disjunction.  Three to 4% of cases of Down syndrome are the result of a translocation involving chromosome #21.  We discussed the option of chromosome analysis to determine if an individual is a carrier of a balanced translocation involving chromosome #21.  If an individual carries a balanced translocation involving chromosome #21, then the chance to have a baby with Down syndrome would be greater than the maternal age-related risk. Given the reported family history, this relative's Down syndrome was most likely sporadic.  Without further information regarding the provided family history, an accurate genetic risk cannot be calculated. Further genetic counseling is warranted if more information is obtained.  Veronica Blackwell  denied exposure to environmental toxins or chemical agents. She denied the use of alcohol or street drugs. She reported smoking 4-5 cigarettes per day. The associations of smoking in pregnancy were reviewed and cessation encouraged. She denied significant viral illnesses during the course of her pregnancy. Her medical and surgical histories were noncontributory.   I counseled Veronica Blackwell for approximately  minutes regarding the above risks and available  options.   Veronica PlowmanKaren Jabori Henegar, MS,   Certified Genetic Counselor  02/09/2016

## 2016-02-16 ENCOUNTER — Telehealth (HOSPITAL_COMMUNITY): Payer: Self-pay | Admitting: MS"

## 2016-02-16 NOTE — Telephone Encounter (Signed)
Called Veronica Blackwell to discuss her prenatal cell free DNA test results.  Ms. Veronica Blackwell had Panorama testing through [facility].  Testing was offered because of abnormal maternal serum screen.   The patient was identified by name and DOB.  We reviewed that these are within normal limits, showing a less than 1 in 10,000 risk for trisomies 21, 18 and 13, and monosomy X (Turner syndrome).  In addition, the risk for triploidy/vanishing twin and sex chromosome trisomies (47,XXX and 47,XXY) was also low risk.  We reviewed that this testing identifies > 99% of pregnancies with trisomy 6421, trisomy 4613, sex chromosome trisomies (47,XXX and 47,XXY), and triploidy. The detection rate for trisomy 18 is 96%.  The detection rate for monosomy X is ~92%.  The false positive rate is <0.1% for all conditions. Testing was also consistent with female fetal sex.  She understands that this testing does not identify all genetic conditions.  All questions were answered to her satisfaction, she was encouraged to call with additional questions or concerns.  Quinn PlowmanKaren Hellon Vaccarella, MS Certified Genetic Counselor 02/16/2016

## 2016-02-18 ENCOUNTER — Other Ambulatory Visit (HOSPITAL_COMMUNITY): Payer: Self-pay

## 2016-02-18 ENCOUNTER — Encounter (HOSPITAL_COMMUNITY): Payer: Self-pay

## 2016-02-25 ENCOUNTER — Encounter: Payer: Self-pay | Admitting: Family Medicine

## 2016-02-25 ENCOUNTER — Ambulatory Visit (INDEPENDENT_AMBULATORY_CARE_PROVIDER_SITE_OTHER): Payer: Medicaid Other | Admitting: Family Medicine

## 2016-02-25 VITALS — BP 106/60 | HR 78 | Wt 177.6 lb

## 2016-02-25 DIAGNOSIS — F17210 Nicotine dependence, cigarettes, uncomplicated: Secondary | ICD-10-CM | POA: Diagnosis not present

## 2016-02-25 DIAGNOSIS — O26892 Other specified pregnancy related conditions, second trimester: Secondary | ICD-10-CM

## 2016-02-25 DIAGNOSIS — O98312 Other infections with a predominantly sexual mode of transmission complicating pregnancy, second trimester: Secondary | ICD-10-CM

## 2016-02-25 DIAGNOSIS — O99320 Drug use complicating pregnancy, unspecified trimester: Secondary | ICD-10-CM

## 2016-02-25 DIAGNOSIS — A6000 Herpesviral infection of urogenital system, unspecified: Secondary | ICD-10-CM

## 2016-02-25 DIAGNOSIS — IMO0002 Reserved for concepts with insufficient information to code with codable children: Secondary | ICD-10-CM

## 2016-02-25 DIAGNOSIS — F1910 Other psychoactive substance abuse, uncomplicated: Secondary | ICD-10-CM | POA: Diagnosis not present

## 2016-02-25 DIAGNOSIS — O0990 Supervision of high risk pregnancy, unspecified, unspecified trimester: Secondary | ICD-10-CM

## 2016-02-25 DIAGNOSIS — O280 Abnormal hematological finding on antenatal screening of mother: Secondary | ICD-10-CM

## 2016-02-25 DIAGNOSIS — O99332 Smoking (tobacco) complicating pregnancy, second trimester: Secondary | ICD-10-CM | POA: Diagnosis not present

## 2016-02-25 DIAGNOSIS — R12 Heartburn: Secondary | ICD-10-CM

## 2016-02-25 DIAGNOSIS — O289 Unspecified abnormal findings on antenatal screening of mother: Secondary | ICD-10-CM

## 2016-02-25 DIAGNOSIS — A6009 Herpesviral infection of other urogenital tract: Secondary | ICD-10-CM | POA: Diagnosis not present

## 2016-02-25 DIAGNOSIS — O34219 Maternal care for unspecified type scar from previous cesarean delivery: Secondary | ICD-10-CM

## 2016-02-25 LAB — POCT URINALYSIS DIP (DEVICE)
Glucose, UA: NEGATIVE mg/dL
Ketones, ur: 40 mg/dL — AB
LEUKOCYTES UA: NEGATIVE
Nitrite: NEGATIVE
Protein, ur: 30 mg/dL — AB
SPECIFIC GRAVITY, URINE: 1.025 (ref 1.005–1.030)
Urobilinogen, UA: 1 mg/dL (ref 0.0–1.0)
pH: 6.5 (ref 5.0–8.0)

## 2016-02-25 MED ORDER — PRENATAL MULTIVITAMIN CH: 1.0000 | ORAL_TABLET | Freq: Every day | ORAL | Status: DC

## 2016-02-25 MED ORDER — RANITIDINE HCL 150 MG PO TABS: 150.0000 mg | ORAL_TABLET | Freq: Two times a day (BID) | ORAL | Status: DC

## 2016-02-25 NOTE — Patient Instructions (Signed)

## 2016-02-25 NOTE — Progress Notes (Signed)
Subjective:  Veronica Blackwell is a 35 y.o. G3P2002 at 1125w1d being seen today for ongoing prenatal care.  She is currently monitored for the following issues for this high-risk pregnancy and has Supervision of high risk pregnancy, antepartum; Genital HSV; Maternal substance abuse, antepartum; Chronic back pain; Cigarette smoker; Previous cesarean section complicating pregnancy, antepartum condition or complication; and Abnormal maternal serum screening test on her problem list.  Patient reports heartburn.  Contractions: Not present. Vag. Bleeding: None.  Movement: Present. Denies leaking of fluid.   The following portions of the patient's history were reviewed and updated as appropriate: allergies, current medications, past family history, past medical history, past social history, past surgical history and problem list. Problem list updated.  Objective:   Filed Vitals:   02/25/16 0903  BP: 106/60  Pulse: 78  Weight: 177 lb 9.6 oz (80.559 kg)    Fetal Status: Fetal Heart Rate (bpm): 152   Movement: Present     General:  Alert, oriented and cooperative. Patient is in no acute distress.  Skin: Skin is warm and dry. No rash noted.   Cardiovascular: Normal heart rate noted  Respiratory: Normal respiratory effort, no problems with respiration noted  Abdomen: Soft, gravid, appropriate for gestational age. Pain/Pressure: Absent     Pelvic: Vag. Bleeding: None     Cervical exam deferred        Extremities: Normal range of motion.  Edema: None  Mental Status: Normal mood and affect. Normal behavior. Normal judgment and thought content.   Urinalysis: Urine Protein: 1+ Urine Glucose: Negative  Assessment and Plan:  Pregnancy: G3P2002 at 9325w1d  1. Supervision of high risk pregnancy, antepartum, unspecified trimester FHT and FH normal.  Bottle feed.  Nexplanon.  Desires circ for her baby.  2. Genital HSV Continue valtrex  3. Maternal substance abuse, antepartum Discussed cocaine, DHS  involvement after delivery, risks to baby and mother including placental abruption.  4. Previous cesarean section complicating pregnancy, antepartum condition or complication Repeat at 39 weeks.  5. Abnormal maternal serum screening test NIPS normal  6. Heartburn in pregnancy in second trimester Zantac prescribed.  Preterm labor symptoms and general obstetric precautions including but not limited to vaginal bleeding, contractions, leaking of fluid and fetal movement were reviewed in detail with the patient. Please refer to After Visit Summary for other counseling recommendations.  Return in about 2 weeks (around 03/10/2016) for HR OB f/u.   Levie HeritageJacob J Aleyssa Pike, DO

## 2016-02-25 NOTE — Progress Notes (Signed)
Pt is currently taking hydrocodone for joint pain. Pt will return in 2 weeks for 1hr.

## 2016-03-10 ENCOUNTER — Encounter: Payer: Medicaid Other | Admitting: Family

## 2016-03-17 ENCOUNTER — Ambulatory Visit (INDEPENDENT_AMBULATORY_CARE_PROVIDER_SITE_OTHER): Payer: Medicaid Other | Admitting: Family Medicine

## 2016-03-17 VITALS — BP 108/63 | HR 88 | Wt 174.0 lb

## 2016-03-17 DIAGNOSIS — A609 Anogenital herpesviral infection, unspecified: Secondary | ICD-10-CM

## 2016-03-17 DIAGNOSIS — F17210 Nicotine dependence, cigarettes, uncomplicated: Secondary | ICD-10-CM

## 2016-03-17 DIAGNOSIS — Z23 Encounter for immunization: Secondary | ICD-10-CM

## 2016-03-17 DIAGNOSIS — O0993 Supervision of high risk pregnancy, unspecified, third trimester: Secondary | ICD-10-CM | POA: Diagnosis not present

## 2016-03-17 DIAGNOSIS — O99333 Smoking (tobacco) complicating pregnancy, third trimester: Secondary | ICD-10-CM

## 2016-03-17 DIAGNOSIS — G8929 Other chronic pain: Secondary | ICD-10-CM

## 2016-03-17 DIAGNOSIS — O98313 Other infections with a predominantly sexual mode of transmission complicating pregnancy, third trimester: Secondary | ICD-10-CM

## 2016-03-17 DIAGNOSIS — M79643 Pain in unspecified hand: Secondary | ICD-10-CM

## 2016-03-17 LAB — POCT URINALYSIS DIP (DEVICE)
Glucose, UA: NEGATIVE mg/dL
Hgb urine dipstick: NEGATIVE
Ketones, ur: NEGATIVE mg/dL
Leukocytes, UA: NEGATIVE
Nitrite: NEGATIVE
Protein, ur: 30 mg/dL — AB
Specific Gravity, Urine: 1.02 (ref 1.005–1.030)
Urobilinogen, UA: 1 mg/dL (ref 0.0–1.0)
pH: 6 (ref 5.0–8.0)

## 2016-03-17 LAB — CBC
HEMATOCRIT: 29.9 % — AB (ref 35.0–45.0)
Hemoglobin: 10.1 g/dL — ABNORMAL LOW (ref 11.7–15.5)
MCH: 31.3 pg (ref 27.0–33.0)
MCHC: 33.8 g/dL (ref 32.0–36.0)
MCV: 92.6 fL (ref 80.0–100.0)
MPV: 12.6 fL — AB (ref 7.5–12.5)
Platelets: 113 10*3/uL — ABNORMAL LOW (ref 140–400)
RBC: 3.23 MIL/uL — AB (ref 3.80–5.10)
RDW: 14.2 % (ref 11.0–15.0)
WBC: 11 10*3/uL — AB (ref 3.8–10.8)

## 2016-03-17 MED ORDER — TETANUS-DIPHTH-ACELL PERTUSSIS 5-2.5-18.5 LF-MCG/0.5 IM SUSP
0.5000 mL | Freq: Once | INTRAMUSCULAR | Status: AC
  Administered 2016-03-17: 0.5 mL via INTRAMUSCULAR

## 2016-03-17 MED ORDER — HYDROCODONE-ACETAMINOPHEN 5-325 MG PO TABS: 1.0000 | ORAL_TABLET | Freq: Four times a day (QID) | ORAL | Status: DC | PRN

## 2016-03-17 NOTE — Progress Notes (Signed)
Tip of week reviewed with patient

## 2016-03-17 NOTE — Progress Notes (Signed)
Subjective:  Veronica Blackwell is a 35 y.o. G3P2002 at 7934w1d being seen today for ongoing prenatal care.  She is currently monitored for the following issues for this high-risk pregnancy and has Supervision of high risk pregnancy, antepartum; Genital HSV; Maternal substance abuse, antepartum; Chronic back pain; Cigarette smoker; Previous cesarean section complicating pregnancy, antepartum condition or complication; and Abnormal maternal serum screening test on her problem list.  Patient reports joint pain - uses vicodin: 1-2 tabs every couple days..  Contractions: Not present. Vag. Bleeding: None.  Movement: Present. Denies leaking of fluid.   The following portions of the patient's history were reviewed and updated as appropriate: allergies, current medications, past family history, past medical history, past social history, past surgical history and problem list. Problem list updated.  Objective:   Filed Vitals:   03/17/16 0956  BP: 108/63  Pulse: 88  Weight: 174 lb (78.926 kg)    Fetal Status: Fetal Heart Rate (bpm): 152   Movement: Present     General:  Alert, oriented and cooperative. Patient is in no acute distress.  Skin: Skin is warm and dry. No rash noted.   Cardiovascular: Normal heart rate noted  Respiratory: Normal respiratory effort, no problems with respiration noted  Abdomen: Soft, gravid, appropriate for gestational age. Pain/Pressure: Absent     Pelvic: Vag. Bleeding: None     Cervical exam deferred        Extremities: Normal range of motion.  Edema: None  Mental Status: Normal mood and affect. Normal behavior. Normal judgment and thought content.   Urinalysis: Urine Protein: 1+ Urine Glucose: Negative  Assessment and Plan:  Pregnancy: G3P2002 at 1734w1d  1. Supervision of high risk pregnancy in third trimester FHT and FH normal.  28 week labs. - CBC - RPR - HIV antibody (with reflex) - Glucose Tolerance, 1 HR (50g) w/o Fasting - Tdap (BOOSTRIX) injection 0.5  mL; Inject 0.5 mLs into the muscle once.  2. Chronic hand pain, unspecified laterality Refill vicodin.  Will check UDS.  Discussed that if + for things other than vicodin, would not be able to prescribe vicodin again.  Pt acknowledged.  Preterm labor symptoms and general obstetric precautions including but not limited to vaginal bleeding, contractions, leaking of fluid and fetal movement were reviewed in detail with the patient. Please refer to After Visit Summary for other counseling recommendations.  No Follow-up on file.   Levie HeritageJacob J Stinson, DO

## 2016-03-18 LAB — HIV ANTIBODY (ROUTINE TESTING W REFLEX): HIV: NONREACTIVE

## 2016-03-18 LAB — RPR

## 2016-03-18 LAB — GLUCOSE TOLERANCE, 1 HOUR (50G) W/O FASTING: GLUCOSE, 1 HR, GESTATIONAL: 135 mg/dL (ref <140)

## 2016-03-22 ENCOUNTER — Telehealth: Payer: Self-pay | Admitting: *Deleted

## 2016-03-22 NOTE — Telephone Encounter (Signed)
Called pt and informed her of abnormal 1hr GTT now requiring 3hr GTT to determine if she has gestational diabetes. Test procedure was explained in detail. Pt voiced understanding and agreed to appt on 5/24 @ 0830.

## 2016-03-23 ENCOUNTER — Encounter (HOSPITAL_COMMUNITY): Payer: Self-pay

## 2016-03-23 ENCOUNTER — Other Ambulatory Visit (HOSPITAL_COMMUNITY): Payer: Self-pay | Admitting: Obstetrics and Gynecology

## 2016-03-23 ENCOUNTER — Ambulatory Visit (HOSPITAL_COMMUNITY)
Admission: RE | Admit: 2016-03-23 | Discharge: 2016-03-23 | Disposition: A | Discharge status: Home / Self Care | Payer: Medicaid Other | Source: Ambulatory Visit | Attending: Obstetrics & Gynecology | Admitting: Obstetrics & Gynecology

## 2016-03-23 VITALS — BP 108/58 | HR 91 | Wt 173.1 lb

## 2016-03-23 DIAGNOSIS — O34219 Maternal care for unspecified type scar from previous cesarean delivery: Secondary | ICD-10-CM | POA: Diagnosis not present

## 2016-03-23 DIAGNOSIS — Z3A30 30 weeks gestation of pregnancy: Secondary | ICD-10-CM

## 2016-03-23 DIAGNOSIS — O99323 Drug use complicating pregnancy, third trimester: Secondary | ICD-10-CM

## 2016-03-23 DIAGNOSIS — O0990 Supervision of high risk pregnancy, unspecified, unspecified trimester: Secondary | ICD-10-CM

## 2016-03-23 DIAGNOSIS — O283 Abnormal ultrasonic finding on antenatal screening of mother: Secondary | ICD-10-CM | POA: Diagnosis not present

## 2016-03-23 DIAGNOSIS — IMO0001 Reserved for inherently not codable concepts without codable children: Secondary | ICD-10-CM

## 2016-03-23 DIAGNOSIS — O358XX0 Maternal care for other (suspected) fetal abnormality and damage, not applicable or unspecified: Principal | ICD-10-CM

## 2016-03-23 DIAGNOSIS — F1910 Other psychoactive substance abuse, uncomplicated: Secondary | ICD-10-CM

## 2016-03-23 DIAGNOSIS — O280 Abnormal hematological finding on antenatal screening of mother: Secondary | ICD-10-CM

## 2016-03-23 DIAGNOSIS — IMO0002 Reserved for concepts with insufficient information to code with codable children: Secondary | ICD-10-CM

## 2016-03-23 LAB — PRESCRIPTION MONITORING PROFILE (SOLSTAS)
AMPHETAMINE/METH: NEGATIVE ng/mL
BENZODIAZEPINE SCREEN, URINE: NEGATIVE ng/mL
BUPRENORPHINE, URINE: NEGATIVE ng/mL
Barbiturate Screen, Urine: NEGATIVE ng/mL
CREATININE, URINE: 291.93 mg/dL (ref >20.0)
Carisoprodol, Urine: NEGATIVE ng/mL
Fentanyl, Ur: NEGATIVE ng/mL
MDMA URINE: NEGATIVE ng/mL
Meperidine, Ur: NEGATIVE ng/mL
Methadone Screen, Urine: NEGATIVE ng/mL
Nitrites, Initial: NEGATIVE ug/mL
PH URINE, INITIAL: 6.4 pH (ref 4.5–8.9)
Propoxyphene: NEGATIVE ng/mL
TAPENTADOLUR: NEGATIVE ng/mL
TRAMADOL UR: NEGATIVE ng/mL
ZOLPIDEM, URINE: NEGATIVE ng/mL

## 2016-03-23 LAB — OXYCODONE, URINE (LC/MS-MS)
Noroxycodone, Ur: >10000 ng/mL — AB (ref <50)
Oxycodone, ur: 3515 ng/mL — AB (ref <50)
Oxymorphone: 766 ng/mL — AB (ref <50)

## 2016-03-23 LAB — CANNABANOIDS (GC/LC/MS), URINE: THC-COOH UR CONFIRM: 130 ng/mL — AB (ref <5)

## 2016-03-23 LAB — OPIATES/OPIOIDS (LC/MS-MS)
Codeine Urine: NEGATIVE ng/mL (ref <50)
HYDROCODONE: NEGATIVE ng/mL (ref <50)
Hydromorphone: NEGATIVE ng/mL (ref <50)
Morphine Urine: NEGATIVE ng/mL (ref <50)
Norhydrocodone, Ur: NEGATIVE ng/mL (ref <50)
OXYCODONE, UR: 3515 ng/mL — AB (ref <50)
OXYMORPHONE, URINE: 766 ng/mL — AB (ref <50)

## 2016-03-23 LAB — COCAINE METABOLITE (GC/LC/MS), URINE: BENZOYLECGONINE GC/MS CONF: 304 ng/mL — AB (ref <100)

## 2016-03-30 ENCOUNTER — Encounter: Payer: Self-pay | Admitting: *Deleted

## 2016-03-30 ENCOUNTER — Other Ambulatory Visit: Payer: Medicaid Other

## 2016-03-30 DIAGNOSIS — R7309 Other abnormal glucose: Secondary | ICD-10-CM

## 2016-03-31 LAB — GLUCOSE TOLERANCE, 3 HOURS
Glucose Tolerance, 1 hour: 152 mg/dL (ref <190)
Glucose Tolerance, 2 hour: 146 mg/dL (ref <165)
Glucose Tolerance, Fasting: 75 mg/dL (ref 65–104)
Glucose, GTT - 3 Hour: 84 mg/dL (ref <145)

## 2016-04-03 ENCOUNTER — Encounter: Payer: Self-pay | Admitting: Family Medicine

## 2016-04-03 DIAGNOSIS — IMO0002 Reserved for concepts with insufficient information to code with codable children: Secondary | ICD-10-CM | POA: Insufficient documentation

## 2016-04-07 ENCOUNTER — Encounter: Payer: Medicaid Other | Admitting: Family

## 2016-04-14 ENCOUNTER — Encounter: Payer: Medicaid Other | Admitting: Family Medicine

## 2016-04-21 ENCOUNTER — Ambulatory Visit (INDEPENDENT_AMBULATORY_CARE_PROVIDER_SITE_OTHER): Payer: Medicaid Other | Admitting: Obstetrics & Gynecology

## 2016-04-21 ENCOUNTER — Ambulatory Visit (INDEPENDENT_AMBULATORY_CARE_PROVIDER_SITE_OTHER): Payer: Self-pay | Admitting: Clinical

## 2016-04-21 VITALS — BP 120/68 | HR 109 | Wt 170.6 lb

## 2016-04-21 DIAGNOSIS — A6009 Herpesviral infection of other urogenital tract: Secondary | ICD-10-CM

## 2016-04-21 DIAGNOSIS — O0993 Supervision of high risk pregnancy, unspecified, third trimester: Secondary | ICD-10-CM

## 2016-04-21 DIAGNOSIS — F1910 Other psychoactive substance abuse, uncomplicated: Secondary | ICD-10-CM

## 2016-04-21 DIAGNOSIS — F17210 Nicotine dependence, cigarettes, uncomplicated: Secondary | ICD-10-CM | POA: Diagnosis not present

## 2016-04-21 DIAGNOSIS — F4323 Adjustment disorder with mixed anxiety and depressed mood: Secondary | ICD-10-CM

## 2016-04-21 DIAGNOSIS — F1990 Other psychoactive substance use, unspecified, uncomplicated: Secondary | ICD-10-CM | POA: Diagnosis not present

## 2016-04-21 DIAGNOSIS — O99323 Drug use complicating pregnancy, third trimester: Secondary | ICD-10-CM | POA: Diagnosis not present

## 2016-04-21 DIAGNOSIS — O98313 Other infections with a predominantly sexual mode of transmission complicating pregnancy, third trimester: Secondary | ICD-10-CM

## 2016-04-21 DIAGNOSIS — IMO0002 Reserved for concepts with insufficient information to code with codable children: Secondary | ICD-10-CM

## 2016-04-21 DIAGNOSIS — O34219 Maternal care for unspecified type scar from previous cesarean delivery: Secondary | ICD-10-CM

## 2016-04-21 DIAGNOSIS — O99333 Smoking (tobacco) complicating pregnancy, third trimester: Secondary | ICD-10-CM | POA: Diagnosis not present

## 2016-04-21 LAB — POCT URINALYSIS DIP (DEVICE)
Bilirubin Urine: NEGATIVE
GLUCOSE, UA: NEGATIVE mg/dL
Hgb urine dipstick: NEGATIVE
Ketones, ur: 15 mg/dL — AB
NITRITE: NEGATIVE
PROTEIN: 30 mg/dL — AB
Specific Gravity, Urine: 1.025 (ref 1.005–1.030)
UROBILINOGEN UA: 1 mg/dL (ref 0.0–1.0)
pH: 5.5 (ref 5.0–8.0)

## 2016-04-21 MED ORDER — PRENATAL MULTIVITAMIN CH: 1.0000 | ORAL_TABLET | Freq: Every day | ORAL | Status: DC

## 2016-04-21 NOTE — Patient Instructions (Signed)

## 2016-04-21 NOTE — Progress Notes (Signed)
Pt requests refill on Vicodin and prenatal vitamons

## 2016-04-21 NOTE — Progress Notes (Signed)
Subjective:  Veronica Blackwell is a 35 y.o. G3P2002 at 6467w1d being seen today for ongoing prenatal care.  She is currently monitored for the following issues for this high-risk pregnancy and has Supervision of high risk pregnancy, antepartum; Genital HSV; Maternal substance abuse, antepartum; Chronic hand pain; Cigarette smoker; Previous cesarean section complicating pregnancy, antepartum condition or complication; Abnormal maternal serum screening test; and Pap smear of anus with ASCUS on her problem list. Will prescribe no more narcotic due to pos UDS Patient reports occasional contractions and pressure.  Contractions: Not present. Vag. Bleeding: None.  Movement: Present. Denies leaking of fluid.   The following portions of the patient's history were reviewed and updated as appropriate: allergies, current medications, past family history, past medical history, past social history, past surgical history and problem list. Problem list updated.  Objective:   Filed Vitals:   04/21/16 1016  BP: 120/68  Pulse: 109  Weight: 170 lb 9.6 oz (77.384 kg)    Fetal Status: Fetal Heart Rate (bpm): 143   Movement: Present     General:  Alert, oriented and cooperative. Patient is in no acute distress.  Skin: Skin is warm and dry. No rash noted.   Cardiovascular: Normal heart rate noted  Respiratory: Normal respiratory effort, no problems with respiration noted  Abdomen: Soft, gravid, appropriate for gestational age. Pain/Pressure: Present     Pelvic: Cervical exam deferred        Extremities: Normal range of motion.  Edema: None  Mental Status: Normal mood and affect. Normal behavior. Normal judgment and thought content.   Urinalysis: Urine Protein: 1+ Urine Glucose: Negative  Assessment and Plan:  Pregnancy: G3P2002 at 5267w1d  1. Supervision of high risk pregnancy, antepartum, third trimester   2. Previous cesarean section complicating pregnancy, antepartum condition or complication Repeat 39  weeks  Preterm labor symptoms and general obstetric precautions including but not limited to vaginal bleeding, contractions, leaking of fluid and fetal movement were reviewed in detail with the patient. Please refer to After Visit Summary for other counseling recommendations.  Return in about 2 weeks (around 05/05/2016).   Adam PhenixJames G Barnes Florek, MD
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
## 2016-05-05 ENCOUNTER — Encounter: Payer: Medicaid Other | Admitting: Obstetrics & Gynecology

## 2016-05-11 ENCOUNTER — Ambulatory Visit (INDEPENDENT_AMBULATORY_CARE_PROVIDER_SITE_OTHER): Payer: Medicaid Other | Admitting: Obstetrics and Gynecology

## 2016-05-11 ENCOUNTER — Other Ambulatory Visit (HOSPITAL_COMMUNITY)
Admission: RE | Admit: 2016-05-11 | Discharge: 2016-05-11 | Disposition: A | Discharge status: Home / Self Care | Payer: Medicaid Other | Source: Ambulatory Visit | Attending: Obstetrics and Gynecology | Admitting: Obstetrics and Gynecology

## 2016-05-11 VITALS — BP 114/68 | HR 107 | Wt 169.1 lb

## 2016-05-11 DIAGNOSIS — O0993 Supervision of high risk pregnancy, unspecified, third trimester: Secondary | ICD-10-CM

## 2016-05-11 DIAGNOSIS — O99323 Drug use complicating pregnancy, third trimester: Secondary | ICD-10-CM

## 2016-05-11 DIAGNOSIS — Z113 Encounter for screening for infections with a predominantly sexual mode of transmission: Secondary | ICD-10-CM | POA: Diagnosis present

## 2016-05-11 DIAGNOSIS — F1910 Other psychoactive substance abuse, uncomplicated: Secondary | ICD-10-CM

## 2016-05-11 DIAGNOSIS — O99320 Drug use complicating pregnancy, unspecified trimester: Secondary | ICD-10-CM

## 2016-05-11 DIAGNOSIS — IMO0002 Reserved for concepts with insufficient information to code with codable children: Secondary | ICD-10-CM

## 2016-05-11 LAB — POCT URINALYSIS DIP (DEVICE)
BILIRUBIN URINE: NEGATIVE
GLUCOSE, UA: NEGATIVE mg/dL
Hgb urine dipstick: NEGATIVE
Ketones, ur: 15 mg/dL — AB
LEUKOCYTES UA: NEGATIVE
NITRITE: NEGATIVE
Protein, ur: NEGATIVE mg/dL
Specific Gravity, Urine: 1.02 (ref 1.005–1.030)
Urobilinogen, UA: 1 mg/dL (ref 0.0–1.0)
pH: 6 (ref 5.0–8.0)

## 2016-05-11 NOTE — Patient Instructions (Signed)
Laparoscopic Tubal Ligation Laparoscopic tubal ligation is a procedure that closes the fallopian tubes at a time other than right after childbirth. When the fallopian tubes are closed, the eggs that are released from the ovaries cannot enter the uterus, and sperm cannot reach the egg. Tubal ligation is also known as getting your "tubes tied." Tubal ligation is done so you will not be able to get pregnant or have a baby. Although this procedure may be undone (reversed), it should be considered permanent and irreversible. If you want to have future pregnancies, you should not have this procedure. LET YOUR HEALTH CARE PROVIDER KNOW ABOUT:  Any allergies you have.  All medicines you are taking, including vitamins, herbs, eye drops, creams, and over-the-counter medicines. This includes any use of steroids, either by mouth or in cream form.  Previous problems you or members of your family have had with the use of anesthetics.  Any blood disorders you have.  Previous surgeries you have had.  Any medical conditions you may have.  Possibility of pregnancy, if this applies.  Any past pregnancies. RISKS AND COMPLICATIONS  Infection.  Bleeding.  Injury to surrounding organs.  Side effects from anesthetics.  Failure of the procedure.  Ectopic pregnancy.  Future regret about having the procedure done. BEFORE THE PROCEDURE  Ask your health care provider about:  Changing or stopping your regular medicines. This is especially important if you are taking diabetes medicines or blood thinners.  Taking medicines such as aspirin and ibuprofen. These medicines can thin your blood. Do not take these medicines before your procedure if your health care provider instructs you not to.  Follow instructions from your health care provider about eating and drinking restrictions.  Plan to have someone take you home after the procedure.  If you go home right after the procedure, plan to have someone  with you for 24 hours. PROCEDURE  You will be given one or more of the following:  A medicine that helps you relax (sedative).  A medicine that numbs the area (local anesthetic).  A medicine that makes you fall asleep (general anesthetic).  A medicine that is injected into an area of your body that numbs everything below the injection site (regional anesthetic).  If you have been given general anesthetic, a tube will be put down your throat to help you breathe.  Two small cuts (incisions) will be made in the lower abdominal area and near the belly button.  Your bladder may be emptied with a small tube (catheter).  Your abdomen will be inflated with a safe gas (carbon dioxide). This will help to give the surgeon room to operate and visualize, and it will help the surgeon to avoid other organs.  A thin, lighted tube (laparoscope) with a camera attached will be inserted into your abdomen through one of the incisions near the belly button. Other small instruments will be inserted through the other abdominal incision.  The fallopian tubes will be tied off or burned (cauterized), or they will be blocked with a clip, ring, or clamp. In many cases, a small portion in the center of each fallopian tube will also be removed.  After the fallopian tubes are blocked, the gas will be released from the abdomen.  The incisions will be closed with stitches (sutures).  A bandage (dressing) will be placed over the incisions. The procedure may vary among health care providers and hospitals. AFTER THE PROCEDURE  Your blood pressure, heart rate, breathing rate, and blood oxygen level   will be monitored often until the medicines you were given have worn off.  You will be given pain medicine as needed.  If you had general anesthetic, you may have some mild discomfort in your throat. This is from the breathing tube that was placed in your throat while you were sleeping.  You may experience discomfort in  the shoulder area from some trapped air between your liver and your diaphragm. This sensation is normal, and it will slowly go away on its own.  You will have some mild abdominal discomfort for 3--7 days.   This information is not intended to replace advice given to you by your health care provider. Make sure you discuss any questions you have with your health care provider.   Document Released: 01/30/2001 Document Revised: 03/10/2015 Document Reviewed: 02/04/2012 Elsevier Interactive Patient Education 2016 [employer]vier Inc[employer]  

## 2016-05-11 NOTE — Progress Notes (Signed)
Pt reports back pain.  

## 2016-05-11 NOTE — Progress Notes (Signed)
Subjective:  Veronica Blackwell is a 35 y.o. G3P2002 at 6533w0d being seen today for ongoing prenatal care.  Patient reports no complaints.  Contractions: Not present.  Vag. Bleeding: None. Movement: Present. Denies leaking of fluid.   The following portions of the patient's history were reviewed and updated as appropriate: allergies, current medications, past family history, past medical history, past social history, past surgical history and problem list.   Objective:   Filed Vitals:   05/11/16 0848  BP: 114/68  Pulse: 107  Weight: 169 lb 1.6 oz (76.703 kg)    Fetal Status: Fetal Heart Rate (bpm): 150   Movement: Present     General:  Alert, oriented and cooperative. Patient is in no acute distress.  Skin: Skin is warm and dry. No rash noted.   Cardiovascular: Normal heart rate noted  Respiratory: Normal respiratory effort, no problems with respiration noted  Abdomen: Soft, gravid, appropriate for gestational age. Pain/Pressure: Present     Vaginal: Vag. Bleeding: None.       Cervix: Not evaluated        Extremities: Normal range of motion.  Edema: None  Mental Status: Normal mood and affect. Normal behavior. Normal judgment and thought content.   Urinalysis:    neg/neg  Assessment and Plan:  Pregnancy: G3P2002 at 9033w0d  1. Supervision of high risk pregnancy, antepartum, third trimester  - [facility]lam[facility] - Culture, beta strep (group b only) - Pain Mgmt, Profile 6 Conf w/o mM, U  2. Maternal substance abuse, antepartum Reports no recent use> UDS sent  Term labor symptoms and general obstetric precautions including but not limited to vaginal bleeding, contractions, leaking of fluid and fetal movement were reviewed in detail with the patient. BTL consent signed Please refer to After Visit Summary for other counseling recommendations.  Return in about 1 week (around 05/18/2016).   Danae Orleanseirdre C Derral Colucci, CNM

## 2016-05-11 NOTE — Progress Notes (Signed)
RC/S scheduled for 05/25/16

## 2016-05-12 LAB — GC/CHLAMYDIA PROBE AMP (~~LOC~~) NOT AT ARMC
CHLAMYDIA, DNA PROBE: NEGATIVE
NEISSERIA GONORRHEA: NEGATIVE

## 2016-05-13 ENCOUNTER — Encounter: Payer: Self-pay | Admitting: *Deleted

## 2016-05-13 LAB — CULTURE, BETA STREP (GROUP B ONLY)

## 2016-05-16 LAB — PAIN MGMT, PROFILE 6 CONF W/O MM, U
6 Acetylmorphine: NEGATIVE ng/mL (ref <10)
ALCOHOL METABOLITES: NEGATIVE ng/mL (ref <500)
AMPHETAMINES: NEGATIVE ng/mL (ref <500)
BARBITURATES: NEGATIVE ng/mL (ref <300)
BENZODIAZEPINES: NEGATIVE ng/mL (ref <100)
Cocaine Metabolite: NEGATIVE ng/mL (ref <150)
Creatinine: 139.5 mg/dL (ref >=20.0)
MARIJUANA METABOLITE: 159 ng/mL — AB (ref <5)
METHADONE METABOLITE: NEGATIVE ng/mL (ref <100)
Marijuana Metabolite: POSITIVE ng/mL — AB (ref <20)
NOROXYCODONE: 914 ng/mL — AB (ref <50)
OXYCODONE: POSITIVE ng/mL — AB (ref <100)
Opiates: NEGATIVE ng/mL (ref <100)
Oxidant: NEGATIVE ug/mL (ref <200)
Oxycodone: 289 ng/mL — ABNORMAL HIGH (ref <50)
Oxymorphone: 928 ng/mL — ABNORMAL HIGH (ref <50)
PH: 6.48 (ref 4.5–9.0)
PHENCYCLIDINE: NEGATIVE ng/mL (ref <25)
Please note:: 0

## 2016-05-18 ENCOUNTER — Ambulatory Visit (INDEPENDENT_AMBULATORY_CARE_PROVIDER_SITE_OTHER): Payer: Medicaid Other | Admitting: Advanced Practice Midwife

## 2016-05-18 VITALS — BP 120/71 | HR 112 | Wt 168.5 lb

## 2016-05-18 DIAGNOSIS — F1910 Other psychoactive substance abuse, uncomplicated: Secondary | ICD-10-CM | POA: Diagnosis not present

## 2016-05-18 DIAGNOSIS — Z23 Encounter for immunization: Secondary | ICD-10-CM | POA: Diagnosis not present

## 2016-05-18 DIAGNOSIS — O0993 Supervision of high risk pregnancy, unspecified, third trimester: Secondary | ICD-10-CM

## 2016-05-18 DIAGNOSIS — O99323 Drug use complicating pregnancy, third trimester: Secondary | ICD-10-CM | POA: Diagnosis not present

## 2016-05-18 LAB — POCT URINALYSIS DIP (DEVICE)
Bilirubin Urine: NEGATIVE
Glucose, UA: 250 mg/dL — AB
HGB URINE DIPSTICK: NEGATIVE
Ketones, ur: 15 mg/dL — AB
LEUKOCYTES UA: NEGATIVE
NITRITE: NEGATIVE
PH: 5.5 (ref 5.0–8.0)
PROTEIN: NEGATIVE mg/dL
Specific Gravity, Urine: 1.02 (ref 1.005–1.030)
UROBILINOGEN UA: 0.2 mg/dL (ref 0.0–1.0)

## 2016-05-18 NOTE — Progress Notes (Signed)
Subjective:  Veronica Blackwell is a 35 y.o. G3P2002 at 5562w0d being seen today for ongoing prenatal care.  She is currently monitored for the following issues for this high-risk pregnancy and has Supervision of high risk pregnancy, antepartum; Genital HSV; Maternal substance abuse, antepartum; Chronic hand pain; Cigarette smoker; Previous cesarean section complicating pregnancy, antepartum condition or complication; Abnormal maternal serum screening test; and Pap smear of anus with ASCUS on her problem list.  Patient reports no complaints.  Contractions: Not present. Vag. Bleeding: None.  Movement: Present. Denies leaking of fluid.   The following portions of the patient's history were reviewed and updated as appropriate: allergies, current medications, past family history, past medical history, past social history, past surgical history and problem list. Problem list updated.  Objective:   Filed Vitals:   05/18/16 0913  BP: 120/71  Pulse: 112  Weight: 168 lb 8 oz (76.431 kg)    Fetal Status: Fetal Heart Rate (bpm): 158   Movement: Present     General:  Alert, oriented and cooperative. Patient is in no acute distress.  Skin: Skin is warm and dry. No rash noted.   Cardiovascular: Normal heart rate noted  Respiratory: Normal respiratory effort, no problems with respiration noted  Abdomen: Soft, gravid, appropriate for gestational age. Pain/Pressure: Present     Pelvic:  Cervical exam deferred        Extremities: Normal range of motion.  Edema: None  Mental Status: Normal mood and affect. Normal behavior. Normal judgment and thought content.   Urinalysis: Urine Protein: Negative Urine Glucose: 2+  Assessment and Plan:  Pregnancy: G3P2002 at 5762w0d  1. Supervision of high risk pregnancy, antepartum, third trimester Discussed preop for C/S  Veronica SpearingMarni will confirm time  >>>>  Was not on schedule.   Will schedule for 05/28/16 at 12 noon with Dr Veronica Blackwell Decided to get TDAP today   Term labor  symptoms and general obstetric precautions including but not limited to vaginal bleeding, contractions, leaking of fluid and fetal movement were reviewed in detail with the patient. Please refer to After Visit Summary for other counseling recommendations.   Return 4 weeks post op   Veronica Blackwell, CNM

## 2016-05-18 NOTE — Patient Instructions (Signed)
Cesarean Delivery, Care After  Refer to this sheet in the next few weeks. These instructions provide you with information on caring for yourself after your procedure. Your health care provider may also give you specific instructions. Your treatment has been planned according to current medical practices, but problems sometimes occur. Call your health care provider if you have any problems or questions after you go home.  HOME CARE INSTRUCTIONS   Only take over-the-counter or prescription medications as directed by your health care provider.   Do not drink alcohol, especially if you are breastfeeding or taking medication to relieve pain.   Do not chew or smoke tobacco.   Continue to use good perineal care. Good perineal care includes:    Wiping your perineum from front to back.    Keeping your perineum clean.   Check your surgical cut (incision) daily for increased redness, drainage, swelling, or separation of skin.   Clean your incision gently with soap and water every day, and then pat it dry. If your health care provider says it is okay, leave the incision uncovered. Use a bandage (dressing) if the incision is draining fluid or appears irritated. If the adhesive strips across the incision do not fall off within 7 days, carefully peel them off.   Hug a pillow when coughing or sneezing until your incision is healed. This helps to relieve pain.   Do not use tampons or douche until your health care provider says it is okay.   Shower, wash your hair, and take tub baths as directed by your health care provider.   Wear a well-fitting bra that provides breast support.   Limit wearing support panties or control-top hose.   Drink enough fluids to keep your urine clear or pale yellow.   Eat high-fiber foods such as whole grain cereals and breads, brown rice, beans, and fresh fruits and vegetables every day. These foods may help prevent or relieve constipation.   Resume activities such as climbing stairs,  driving, lifting, exercising, or traveling as directed by your health care provider.   Talk to your health care provider about resuming sexual activities. This is dependent upon your risk of infection, your rate of healing, and your comfort and desire to resume sexual activity.   Try to have someone help you with your household activities and your newborn for at least a few days after you leave the hospital.   Rest as much as possible. Try to rest or take a nap when your newborn is sleeping.   Increase your activities gradually.   Keep all of your scheduled postpartum appointments. It is very important to keep your scheduled follow-up appointments. At these appointments, your health care provider will be checking to make sure that you are healing physically and emotionally.  SEEK MEDICAL CARE IF:    You are passing large clots from your vagina. Save any clots to show your health care provider.   You have a foul smelling discharge from your vagina.   You have trouble urinating.   You are urinating frequently.   You have pain when you urinate.   You have a change in your bowel movements.   You have increasing redness, pain, or swelling near your incision.   You have pus draining from your incision.   Your incision is separating.   You have painful, hard, or reddened breasts.   You have a severe headache.   You have blurred vision or see spots.   You feel sad   or depressed.   You have thoughts of hurting yourself or your newborn.   You have questions about your care, the care of your newborn, or medications.   You are dizzy or light-headed.   You have a rash.   You have pain, redness, or swelling at the site of the removed intravenous access (IV) tube.   You have nausea or vomiting.   You stopped breastfeeding and have not had a menstrual period within 12 weeks of stopping.   You are not breastfeeding and have not had a menstrual period within 12 weeks of delivery.   You have a fever.  SEEK  IMMEDIATE MEDICAL CARE IF:   You have persistent pain.   You have chest pain.   You have shortness of breath.   You faint.   You have leg pain.   You have stomach pain.   Your vaginal bleeding saturates 2 or more sanitary pads in 1 hour.  MAKE SURE YOU:    Understand these instructions.   Will watch your condition.   Will get help right away if you are not doing well or get worse.     This information is not intended to replace advice given to you by your health care provider. Make sure you discuss any questions you have with your health care provider.     Document Released: 07/16/2002 Document Revised: 11/14/2014 Document Reviewed: 06/20/2012  Elsevier Interactive Patient Education 2016 Elsevier Inc.

## 2016-05-22 ENCOUNTER — Other Ambulatory Visit: Payer: Self-pay | Admitting: Family Medicine

## 2016-05-23 ENCOUNTER — Inpatient Hospital Stay (HOSPITAL_COMMUNITY): Payer: Medicaid Other | Admitting: Anesthesiology

## 2016-05-23 ENCOUNTER — Encounter (HOSPITAL_COMMUNITY)
Admission: AD | Disposition: A | Discharge status: Home / Self Care | Payer: Self-pay | Source: Ambulatory Visit | Attending: Obstetrics and Gynecology

## 2016-05-23 ENCOUNTER — Encounter (HOSPITAL_COMMUNITY): Payer: Self-pay | Admitting: *Deleted

## 2016-05-23 ENCOUNTER — Inpatient Hospital Stay (HOSPITAL_COMMUNITY)
Admission: AD | Admit: 2016-05-23 | Discharge: 2016-05-26 | DRG: 765 | Disposition: A | Discharge status: Home / Self Care | Payer: Medicaid Other | Source: Ambulatory Visit | Attending: Obstetrics and Gynecology | Admitting: Obstetrics and Gynecology

## 2016-05-23 DIAGNOSIS — O99320 Drug use complicating pregnancy, unspecified trimester: Secondary | ICD-10-CM

## 2016-05-23 DIAGNOSIS — O99334 Smoking (tobacco) complicating childbirth: Secondary | ICD-10-CM | POA: Diagnosis present

## 2016-05-23 DIAGNOSIS — Z3A38 38 weeks gestation of pregnancy: Secondary | ICD-10-CM

## 2016-05-23 DIAGNOSIS — Z8249 Family history of ischemic heart disease and other diseases of the circulatory system: Secondary | ICD-10-CM | POA: Diagnosis not present

## 2016-05-23 DIAGNOSIS — A6000 Herpesviral infection of urogenital system, unspecified: Secondary | ICD-10-CM | POA: Diagnosis present

## 2016-05-23 DIAGNOSIS — Z833 Family history of diabetes mellitus: Secondary | ICD-10-CM | POA: Diagnosis not present

## 2016-05-23 DIAGNOSIS — O99324 Drug use complicating childbirth: Secondary | ICD-10-CM | POA: Diagnosis present

## 2016-05-23 DIAGNOSIS — O4202 Full-term premature rupture of membranes, onset of labor within 24 hours of rupture: Secondary | ICD-10-CM | POA: Diagnosis present

## 2016-05-23 DIAGNOSIS — F17210 Nicotine dependence, cigarettes, uncomplicated: Secondary | ICD-10-CM | POA: Diagnosis present

## 2016-05-23 DIAGNOSIS — O9832 Other infections with a predominantly sexual mode of transmission complicating childbirth: Secondary | ICD-10-CM | POA: Diagnosis present

## 2016-05-23 DIAGNOSIS — F1210 Cannabis abuse, uncomplicated: Secondary | ICD-10-CM | POA: Diagnosis present

## 2016-05-23 DIAGNOSIS — D62 Acute posthemorrhagic anemia: Secondary | ICD-10-CM | POA: Diagnosis not present

## 2016-05-23 DIAGNOSIS — O34219 Maternal care for unspecified type scar from previous cesarean delivery: Secondary | ICD-10-CM | POA: Diagnosis not present

## 2016-05-23 DIAGNOSIS — Z841 Family history of disorders of kidney and ureter: Secondary | ICD-10-CM

## 2016-05-23 DIAGNOSIS — IMO0002 Reserved for concepts with insufficient information to code with codable children: Secondary | ICD-10-CM

## 2016-05-23 DIAGNOSIS — O9081 Anemia of the puerperium: Secondary | ICD-10-CM | POA: Diagnosis not present

## 2016-05-23 DIAGNOSIS — R85610 Atypical squamous cells of undetermined significance on cytologic smear of anus (ASC-US): Secondary | ICD-10-CM

## 2016-05-23 DIAGNOSIS — O280 Abnormal hematological finding on antenatal screening of mother: Secondary | ICD-10-CM

## 2016-05-23 DIAGNOSIS — F1410 Cocaine abuse, uncomplicated: Secondary | ICD-10-CM | POA: Diagnosis present

## 2016-05-23 DIAGNOSIS — O34211 Maternal care for low transverse scar from previous cesarean delivery: Secondary | ICD-10-CM | POA: Diagnosis present

## 2016-05-23 HISTORY — DX: Cocaine abuse, uncomplicated: F14.10

## 2016-05-23 LAB — RAPID URINE DRUG SCREEN, HOSP PERFORMED
Amphetamines: NOT DETECTED
Barbiturates: NOT DETECTED
Benzodiazepines: NOT DETECTED
COCAINE: NOT DETECTED
OPIATES: NOT DETECTED
Tetrahydrocannabinol: POSITIVE — AB

## 2016-05-23 LAB — CBC
HEMATOCRIT: 32.7 % — AB (ref 36.0–46.0)
Hemoglobin: 11.2 g/dL — ABNORMAL LOW (ref 12.0–15.0)
MCH: 30.3 pg (ref 26.0–34.0)
MCHC: 34.3 g/dL (ref 30.0–36.0)
MCV: 88.4 fL (ref 78.0–100.0)
Platelets: 101 10*3/uL — ABNORMAL LOW (ref 150–400)
RBC: 3.7 MIL/uL — ABNORMAL LOW (ref 3.87–5.11)
RDW: 14.9 % (ref 11.5–15.5)
WBC: 16.5 10*3/uL — AB (ref 4.0–10.5)

## 2016-05-23 LAB — TYPE AND SCREEN
ABO/RH(D): B POS
Antibody Screen: NEGATIVE

## 2016-05-23 LAB — ABO/RH: ABO/RH(D): B POS

## 2016-05-23 LAB — POCT FERN TEST: POCT Fern Test: POSITIVE

## 2016-05-23 SURGERY — Surgical Case
Anesthesia: Spinal | Site: Abdomen

## 2016-05-23 MED ORDER — MORPHINE SULFATE (PF) 0.5 MG/ML IJ SOLN
INTRAMUSCULAR | Status: AC
  Filled 2016-05-23: qty 10

## 2016-05-23 MED ORDER — CHLOROPROCAINE HCL (PF) 3 % IJ SOLN
INTRAMUSCULAR | Status: AC
  Filled 2016-05-23: qty 20

## 2016-05-23 MED ORDER — FAMOTIDINE IN NACL 20-0.9 MG/50ML-% IV SOLN
20.0000 mg | Freq: Once | INTRAVENOUS | Status: AC
  Administered 2016-05-23: 20 mg via INTRAVENOUS
  Filled 2016-05-23: qty 50

## 2016-05-23 MED ORDER — DEXAMETHASONE SODIUM PHOSPHATE 4 MG/ML IJ SOLN
INTRAMUSCULAR | Status: DC | PRN
  Administered 2016-05-23: 4 mg via INTRAVENOUS

## 2016-05-23 MED ORDER — DEXTROSE 5 % IV SOLN
500.0000 mg | Freq: Once | INTRAVENOUS | Status: AC
  Administered 2016-05-23: 500 mg via INTRAVENOUS
  Filled 2016-05-23: qty 500

## 2016-05-23 MED ORDER — FENTANYL CITRATE (PF) 100 MCG/2ML IJ SOLN
INTRAMUSCULAR | Status: DC | PRN
  Administered 2016-05-23 (×2): 100 ug via INTRAVENOUS

## 2016-05-23 MED ORDER — SIMETHICONE 80 MG PO CHEW
80.0000 mg | CHEWABLE_TABLET | Freq: Three times a day (TID) | ORAL | Status: DC
  Administered 2016-05-23 – 2016-05-26 (×8): 80 mg via ORAL
  Filled 2016-05-23 (×9): qty 1

## 2016-05-23 MED ORDER — MENTHOL 3 MG MT LOZG: 1.0000 | LOZENGE | OROMUCOSAL | Status: DC | PRN

## 2016-05-23 MED ORDER — LACTATED RINGERS IV BOLUS (SEPSIS)
1000.0000 mL | Freq: Once | INTRAVENOUS | Status: AC
  Administered 2016-05-23: 1000 mL via INTRAVENOUS

## 2016-05-23 MED ORDER — SOD CITRATE-CITRIC ACID 500-334 MG/5ML PO SOLN
30.0000 mL | Freq: Once | ORAL | Status: AC
  Administered 2016-05-23: 30 mL via ORAL
  Filled 2016-05-23: qty 15

## 2016-05-23 MED ORDER — BUPIVACAINE IN DEXTROSE 0.75-8.25 % IT SOLN
INTRATHECAL | Status: DC | PRN
  Administered 2016-05-23: 1.3 mL via INTRATHECAL

## 2016-05-23 MED ORDER — LACTATED RINGERS IV SOLN
INTRAVENOUS | Status: DC
  Administered 2016-05-23 (×2): via INTRAVENOUS

## 2016-05-23 MED ORDER — SCOPOLAMINE 1 MG/3DAYS TD PT72
MEDICATED_PATCH | TRANSDERMAL | Status: AC
  Filled 2016-05-23: qty 1

## 2016-05-23 MED ORDER — ACETAMINOPHEN 325 MG PO TABS: 650.0000 mg | ORAL_TABLET | ORAL | Status: DC | PRN

## 2016-05-23 MED ORDER — POLYETHYLENE GLYCOL 3350 17 G PO PACK
17.0000 g | PACK | Freq: Every day | ORAL | Status: DC
  Administered 2016-05-24 – 2016-05-26 (×2): 17 g via ORAL
  Filled 2016-05-23 (×4): qty 1

## 2016-05-23 MED ORDER — PRENATAL MULTIVITAMIN CH
1.0000 | ORAL_TABLET | Freq: Every day | ORAL | Status: DC
  Administered 2016-05-24 – 2016-05-26 (×2): 1 via ORAL
  Filled 2016-05-23 (×3): qty 1

## 2016-05-23 MED ORDER — HYDROMORPHONE HCL 1 MG/ML IJ SOLN
INTRAMUSCULAR | Status: AC
  Filled 2016-05-23: qty 1

## 2016-05-23 MED ORDER — COCONUT OIL OIL: 1.0000 "application " | TOPICAL_OIL | Status: DC | PRN

## 2016-05-23 MED ORDER — ACETAMINOPHEN 500 MG PO TABS
1000.0000 mg | ORAL_TABLET | Freq: Four times a day (QID) | ORAL | Status: AC
  Administered 2016-05-23 – 2016-05-24 (×3): 1000 mg via ORAL
  Filled 2016-05-23 (×3): qty 2

## 2016-05-23 MED ORDER — FENTANYL CITRATE (PF) 250 MCG/5ML IJ SOLN
INTRAMUSCULAR | Status: AC
Start: 2016-05-23 — End: 2016-05-23
  Filled 2016-05-23: qty 5

## 2016-05-23 MED ORDER — NICOTINE 14 MG/24HR TD PT24
14.0000 mg | MEDICATED_PATCH | Freq: Every day | TRANSDERMAL | Status: DC
  Administered 2016-05-23 – 2016-05-24 (×2): 14 mg via TRANSDERMAL
  Filled 2016-05-23 (×5): qty 1

## 2016-05-23 MED ORDER — SCOPOLAMINE 1 MG/3DAYS TD PT72
MEDICATED_PATCH | TRANSDERMAL | Status: DC | PRN
  Administered 2016-05-23: 1 via TRANSDERMAL

## 2016-05-23 MED ORDER — OXYTOCIN 10 UNIT/ML IJ SOLN
40.0000 [IU] | INTRAVENOUS | Status: DC | PRN
  Administered 2016-05-23: 40 [IU] via INTRAVENOUS

## 2016-05-23 MED ORDER — OXYCODONE HCL 5 MG PO TABS
5.0000 mg | ORAL_TABLET | Freq: Four times a day (QID) | ORAL | Status: DC | PRN
  Administered 2016-05-23 – 2016-05-26 (×11): 10 mg via ORAL
  Filled 2016-05-23 (×11): qty 2

## 2016-05-23 MED ORDER — PHENYLEPHRINE 8 MG IN D5W 100 ML (0.08MG/ML) PREMIX OPTIME
INJECTION | INTRAVENOUS | Status: AC
  Filled 2016-05-23: qty 100

## 2016-05-23 MED ORDER — FENTANYL CITRATE (PF) 100 MCG/2ML IJ SOLN
INTRAMUSCULAR | Status: AC
Start: 2016-05-23 — End: 2016-05-23
  Filled 2016-05-23: qty 2

## 2016-05-23 MED ORDER — ONDANSETRON HCL 4 MG/2ML IJ SOLN
INTRAMUSCULAR | Status: AC
  Filled 2016-05-23: qty 2

## 2016-05-23 MED ORDER — OXYCODONE-ACETAMINOPHEN 5-325 MG PO TABS: 1.0000 | ORAL_TABLET | ORAL | Status: DC | PRN

## 2016-05-23 MED ORDER — OXYCODONE-ACETAMINOPHEN 5-325 MG PO TABS: 2.0000 | ORAL_TABLET | ORAL | Status: DC | PRN

## 2016-05-23 MED ORDER — DEXAMETHASONE SODIUM PHOSPHATE 4 MG/ML IJ SOLN
INTRAMUSCULAR | Status: AC
  Filled 2016-05-23: qty 1

## 2016-05-23 MED ORDER — CEFAZOLIN SODIUM-DEXTROSE 2-4 GM/100ML-% IV SOLN
2.0000 g | INTRAVENOUS | Status: AC
  Administered 2016-05-23: 2 g via INTRAVENOUS
  Filled 2016-05-23: qty 100

## 2016-05-23 MED ORDER — CHLOROPROCAINE HCL (PF) 3 % IJ SOLN
INTRAMUSCULAR | Status: DC | PRN
Start: 2016-05-23 — End: 2016-05-23
  Administered 2016-05-23: 20 mL

## 2016-05-23 MED ORDER — DIPHENHYDRAMINE HCL 25 MG PO CAPS: 25.0000 mg | ORAL_CAPSULE | Freq: Four times a day (QID) | ORAL | Status: DC | PRN

## 2016-05-23 MED ORDER — DIBUCAINE 1 % RE OINT: 1.0000 "application " | TOPICAL_OINTMENT | RECTAL | Status: DC | PRN

## 2016-05-23 MED ORDER — IBUPROFEN 600 MG PO TABS
600.0000 mg | ORAL_TABLET | Freq: Four times a day (QID) | ORAL | Status: DC
  Administered 2016-05-23 – 2016-05-26 (×11): 600 mg via ORAL
  Filled 2016-05-23 (×12): qty 1

## 2016-05-23 MED ORDER — PHENYLEPHRINE 40 MCG/ML (10ML) SYRINGE FOR IV PUSH (FOR BLOOD PRESSURE SUPPORT)
PREFILLED_SYRINGE | INTRAVENOUS | Status: AC
  Filled 2016-05-23: qty 10

## 2016-05-23 MED ORDER — WITCH HAZEL-GLYCERIN EX PADS: 1.0000 "application " | MEDICATED_PAD | CUTANEOUS | Status: DC | PRN

## 2016-05-23 MED ORDER — PHENYLEPHRINE HCL 10 MG/ML IJ SOLN
INTRAMUSCULAR | Status: DC | PRN
  Administered 2016-05-23: 40 ug via INTRAVENOUS

## 2016-05-23 MED ORDER — HYDROMORPHONE HCL 1 MG/ML IJ SOLN
INTRAMUSCULAR | Status: DC | PRN
  Administered 2016-05-23: 1 mg via INTRAVENOUS
  Administered 2016-05-23 (×2): 0.5 mg via INTRAVENOUS

## 2016-05-23 MED ORDER — OXYTOCIN 40 UNITS IN LACTATED RINGERS INFUSION - SIMPLE MED: 2.5000 [IU]/h | INTRAVENOUS | Status: AC

## 2016-05-23 MED ORDER — OXYTOCIN 10 UNIT/ML IJ SOLN
INTRAMUSCULAR | Status: AC
  Filled 2016-05-23: qty 4

## 2016-05-23 MED ORDER — PHENYLEPHRINE 8 MG IN D5W 100 ML (0.08MG/ML) PREMIX OPTIME
INJECTION | INTRAVENOUS | Status: DC | PRN
  Administered 2016-05-23: 60 ug/min via INTRAVENOUS

## 2016-05-23 MED ORDER — SODIUM CHLORIDE 0.9 % IR SOLN
Status: DC | PRN
  Administered 2016-05-23: 1000 mL

## 2016-05-23 MED ORDER — LACTATED RINGERS IV SOLN
INTRAVENOUS | Status: DC | PRN
Start: 2016-05-23 — End: 2016-05-23
  Administered 2016-05-23: 10:00:00 via INTRAVENOUS

## 2016-05-23 MED ORDER — ONDANSETRON HCL 4 MG/2ML IJ SOLN
INTRAMUSCULAR | Status: DC | PRN
  Administered 2016-05-23: 4 mg via INTRAVENOUS

## 2016-05-23 MED ORDER — LACTATED RINGERS IV SOLN
INTRAVENOUS | Status: DC
  Administered 2016-05-23: 18:00:00 via INTRAVENOUS

## 2016-05-23 MED ORDER — ACETAMINOPHEN 160 MG/5ML PO SOLN
1000.0000 mg | Freq: Once | ORAL | Status: AC
  Administered 2016-05-23: 1000 mg via ORAL
  Filled 2016-05-23: qty 40

## 2016-05-23 SURGICAL SUPPLY — 31 items
APL SKNCLS STERI-STRIP NONHPOA (GAUZE/BANDAGES/DRESSINGS) ×1
BARRIER ADHS 3X4 INTERCEED (GAUZE/BANDAGES/DRESSINGS) IMPLANT
BENZOIN TINCTURE PRP APPL 2/3 (GAUZE/BANDAGES/DRESSINGS) ×2 IMPLANT
BRR ADH 4X3 ABS CNTRL BYND (GAUZE/BANDAGES/DRESSINGS)
CHLORAPREP W/TINT 26ML (MISCELLANEOUS) ×3 IMPLANT
CLAMP CORD UMBIL (MISCELLANEOUS) ×2 IMPLANT
CLOSURE WOUND 1/2 X4 (GAUZE/BANDAGES/DRESSINGS) ×1
CLOTH BEACON ORANGE TIMEOUT ST (SAFETY) ×3 IMPLANT
DRSG OPSITE POSTOP 4X10 (GAUZE/BANDAGES/DRESSINGS) ×3 IMPLANT
ELECT REM PT RETURN 9FT ADLT (ELECTROSURGICAL) ×3
ELECTRODE REM PT RTRN 9FT ADLT (ELECTROSURGICAL) ×1 IMPLANT
GLOVE BIO SURGEON STRL SZ 6.5 (GLOVE) ×2 IMPLANT
GLOVE BIO SURGEONS STRL SZ 6.5 (GLOVE) ×1
GLOVE BIOGEL PI IND STRL 7.0 (GLOVE) ×2 IMPLANT
GLOVE BIOGEL PI INDICATOR 7.0 (GLOVE) ×4
GOWN STRL REUS W/TWL LRG LVL3 (GOWN DISPOSABLE) ×6 IMPLANT
NDL HYPO 25X5/8 SAFETYGLIDE (NEEDLE) IMPLANT
NEEDLE HYPO 25X5/8 SAFETYGLIDE (NEEDLE) ×3 IMPLANT
NS IRRIG 1000ML POUR BTL (IV SOLUTION) ×3 IMPLANT
PACK C SECTION WH (CUSTOM PROCEDURE TRAY) ×3 IMPLANT
PAD OB MATERNITY 4.3X12.25 (PERSONAL CARE ITEMS) ×3 IMPLANT
PENCIL SMOKE EVAC W/HOLSTER (ELECTROSURGICAL) ×3 IMPLANT
SPONGE LAP 18X18 X RAY DECT (DISPOSABLE) ×4 IMPLANT
STRIP CLOSURE SKIN 1/2X4 (GAUZE/BANDAGES/DRESSINGS) ×1 IMPLANT
SUT MON AB 2-0 CT1 36 (SUTURE) ×2 IMPLANT
SUT VIC AB 0 CT1 36 (SUTURE) ×20 IMPLANT
SUT VIC AB 2-0 CT1 27 (SUTURE) ×3
SUT VIC AB 2-0 CT1 TAPERPNT 27 (SUTURE) ×1 IMPLANT
SUT VIC AB 4-0 PS2 27 (SUTURE) ×3 IMPLANT
TOWEL OR 17X24 6PK STRL BLUE (TOWEL DISPOSABLE) ×3 IMPLANT
TRAY FOLEY CATH SILVER 16FR LF (SET/KITS/TRAYS/PACK) ×2 IMPLANT

## 2016-05-23 NOTE — Anesthesia Postprocedure Evaluation (Signed)
Anesthesia Post Note  Patient: Veronica Blackwell  Procedure(s) Performed: Procedure(s) (LRB): CESAREAN SECTION (N/A)  Patient location during evaluation: PACU Anesthesia Type: Spinal Level of consciousness: awake Pain management: satisfactory to patient Vital Signs Assessment: post-procedure vital signs reviewed and stable Respiratory status: spontaneous breathing Cardiovascular status: blood pressure returned to baseline Postop Assessment: no headache and spinal receding Anesthetic complications: no     Last Vitals:  Filed Vitals:   05/23/16 1215 05/23/16 1230  BP: 106/77 102/72  Pulse: 66 72  Temp: 36.6 C 36.8 C  Resp: 15 18    Last Pain:  Filed Vitals:   05/23/16 1230  PainSc: 0-No pain   Pain Goal:                 Adonai Selsor EDWARD

## 2016-05-23 NOTE — Transfer of Care (Signed)
Immediate Anesthesia Transfer of Care Note  Patient: Veronica Blackwell  Procedure(s) Performed: Procedure(s): CESAREAN SECTION (N/A)  Patient Location: PACU  Anesthesia Type:Spinal  Level of Consciousness: awake, alert  and oriented  Airway & Oxygen Therapy: Patient Spontanous Breathing  Post-op Assessment: Report given to RN and Post -op Vital signs reviewed and stable  Post vital signs: Reviewed and stable  Last Vitals:  Filed Vitals:   05/23/16 0805 05/23/16 0806  BP: 123/79   Pulse: 87 89  Temp: 36.8 C   Resp: 18     Last Pain:  Filed Vitals:   05/23/16 1106  PainSc: 0-No pain         Complications: No apparent anesthesia complications

## 2016-05-23 NOTE — Anesthesia Postprocedure Evaluation (Signed)
Anesthesia Post Note  Patient: Veronica Blackwell  Procedure(s) Performed: Procedure(s) (LRB): CESAREAN SECTION (N/A)  Patient location during evaluation: Mother Baby Anesthesia Type: Spinal Level of consciousness: oriented and awake and alert Pain management: pain level controlled Vital Signs Assessment: post-procedure vital signs reviewed and stable Respiratory status: spontaneous breathing, respiratory function stable and patient connected to nasal cannula oxygen Cardiovascular status: blood pressure returned to baseline and stable Postop Assessment: no headache and no backache Anesthetic complications: no     Last Vitals:  Filed Vitals:   05/23/16 1300 05/23/16 1400  BP: 108/68 119/71  Pulse: 66 70  Temp: 36.6 C 36.4 C  Resp: 16 18    Last Pain:  Filed Vitals:   05/23/16 1526  PainSc: 2    Pain Goal: Patients Stated Pain Goal: 3 (05/23/16 1400)               Anthon Harpole

## 2016-05-23 NOTE — Anesthesia Preprocedure Evaluation (Signed)
Anesthesia Evaluation  Patient identified by MRN, date of birth, ID band Patient awake    Reviewed: Allergy & Precautions, H&P , Patient's Chart, lab work & pertinent test results  Airway Mallampati: II  TM Distance: >3 FB Neck ROM: full    Dental no notable dental hx.    Pulmonary Current Smoker,    Pulmonary exam normal breath sounds clear to auscultation       Cardiovascular Exercise Tolerance: Good  Rhythm:regular Rate:Normal     Neuro/Psych    GI/Hepatic (+)     substance abuse  cocaine use,   Endo/Other    Renal/GU      Musculoskeletal   Abdominal   Peds  Hematology   Anesthesia Other Findings   Reproductive/Obstetrics                             Anesthesia Physical Anesthesia Plan  ASA: II  Anesthesia Plan: Spinal   Post-op Pain Management:    Induction:   Airway Management Planned:   Additional Equipment:   Intra-op Plan:   Post-operative Plan:   Informed Consent: I have reviewed the patients History and Physical, chart, labs and discussed the procedure including the risks, benefits and alternatives for the proposed anesthesia with the patient or authorized representative who has indicated his/her understanding and acceptance.   Dental Advisory Given  Plan Discussed with: CRNA  Anesthesia Plan Comments: (Lab work confirmed with CRNA in room. Platelets okay. Discussed spinal anesthetic, and patient consents to the procedure:  included risk of possible headache,backache, failed block, allergic reaction, and nerve injury. This patient was asked if she had any questions or concerns before the procedure started. )        Anesthesia Quick Evaluation

## 2016-05-23 NOTE — H&P (Signed)
LABOR AND DELIVERY ADMISSION HISTORY AND PHYSICAL NOTE  Veronica Blackwell is a 35 y.o. female 33P2002 with IUP at 3948w5d by LMP presenting for PROM @0500 .   She reports positive fetal movement. She denies leakage of fluid or vaginal bleeding.  Prenatal History/Complications:  Past Medical History: Past Medical History  Diagnosis Date  . Migraine   . Genital herpes     Past Surgical History: Past Surgical History  Procedure Laterality Date  . Artery repair      L wrist  . Foot surgery      Right  . Cesarean section      Obstetrical History: OB History    Gravida Para Term Preterm AB TAB SAB Ectopic Multiple Living   3 2 2  0 0 0 0 0 0 2      Social History: Social History   Social History  . Marital Status: Single    Spouse Name: N/A  . Number of Children: N/A  . Years of Education: N/A   Social History Main Topics  . Smoking status: Current Every Day Smoker    Types: Cigarettes  . Smokeless tobacco: Never Used  . Alcohol Use: Yes     Comment: Social, not now  . Drug Use: No  . Sexual Activity: Not Asked   Other Topics Concern  . None   Social History Narrative    Family History: Family History  Problem Relation Age of Onset  . Diabetes Mother   . Heart disease Mother   . Kidney disease Mother   . Early death Mother [id number]    heart failure  . Hypertension Mother     Allergies: Allergies  Allergen Reactions  . Latex Anaphylaxis    Prescriptions prior to admission  Medication Sig Dispense Refill Last Dose  . acetaminophen (TYLENOL) 325 MG tablet Take 2 tablets (650 mg total) by mouth every 4 (four) hours as needed. 100 tablet 2 05/23/2016 at Unknown time  . Prenatal Vit-Fe Fumarate-FA (PRENATAL MULTIVITAMIN) TABS tablet Take 1 tablet by mouth daily at 12 noon. 90 tablet 1 Past Week at Unknown time  . HYDROcodone-acetaminophen (NORCO/VICODIN) 5-325 MG tablet Take 1 tablet by mouth every 6 (six) hours as needed for moderate pain. Reported on  02/09/2016 30 tablet 0 Not Taking  . ranitidine (ZANTAC) 150 MG tablet Take 1 tablet (150 mg total) by mouth 2 (two) times daily. (Patient not taking: Reported on 05/11/2016) 90 tablet 1 Not Taking  . valACYclovir (VALTREX) 500 MG tablet Take 500 mg by mouth daily.   Taking     Review of Systems   All systems reviewed and negative except as stated in HPI  Blood pressure 117/75, pulse 102, temperature 98.6 F (37 C), temperature source Oral, resp. rate 18, last menstrual period 08/18/2015. General appearance: alert, cooperative and no distress Lungs: clear to auscultation bilaterally Heart: regular rate and rhythm Abdomen: soft, non-tender; bowel sounds normal Extremities: No calf swelling or tenderness Fetal monitoring: cat 1 Uterine activity: ctx  q6 Dilation: Fingertip Effacement (%): Thick Exam by:: K Henson   Prenatal labs: ABO, Rh: B/Positive/-- (01/09 0000) Antibody: Negative (01/09 0000) Rubella: !Error! RPR: NON REAC (05/11 1042)  HBsAg: Negative (01/09 0000)  HIV: NONREACTIVE (05/11 1042)  GBS:   negative 1 hr Glucola: Third trimester: 135      3hr GTT: 75, 152, 146, 84 Genetic screening:  normal Anatomy US: fetal ptyalectasis   Prenatal Transfer Tool  Maternal Diabetes: No Genetic Screening: Normal Maternal Ultrasounds/Referrals: Abnormal:  Findings:   Fetal Kidney Anomalies Fetal Ultrasounds or other Referrals:  None Maternal Substance Abuse:  Yes:  Type: Smoker, Marijuana, Cocaine, Prescription drugs Significant Maternal Medications:  None Significant Maternal Lab Results: None  Results for orders placed or performed during the hospital encounter of 05/23/16 (from the past 24 hour(s))  Fern Test   Collection Time: 05/23/16  6:47 AM  Result Value Ref Range   POCT Fern Test Positive = ruptured amniotic membanes     Patient Active Problem List   Diagnosis Date Noted  . Pap smear of anus with ASCUS 04/03/2016  . Abnormal maternal serum screening test  02/09/2016  . Supervision of high risk pregnancy, antepartum 02/03/2016  . Genital HSV 02/03/2016  . Maternal substance abuse, antepartum 02/03/2016  . Chronic hand pain 02/03/2016  . Cigarette smoker 02/03/2016  . Previous cesarean section complicating pregnancy, antepartum condition or complication 02/03/2016    Assessment: Veronica Blackwell is a 35 y.o. G3P2002 at [redacted]w[redacted]d here for PROM and subsequent rLTCS.  #Labor:PROM, will post for C section today #Pain: Will have epidural  #FWB: Cat 1 #ID: GBS neg #MOF: bottle #MOC: BTL #Circ:  no  Loni Muse 05/23/2016, 7:13 AM

## 2016-05-23 NOTE — Op Note (Signed)
SURGERY DATE: 05/23/16  PRE-OP DIAGNOSIS:  Intrauterine pregnancy @ 38 5/7 weeks with premature rupture of membranes, repeat cesarean.   POST-OP DIAGNOSIS: same   PROCEDURE: Repeat low transverse cesarean section via pfannenstiel skin incision with double layer uterine closure  SURGEON: Calera Bingharlie Pickens, MD  ASSISTANT: Earnstine RegalElizabeth W Kamren Heintzelman, DO  ANESTHESIA: spinal  ESTIMATED BLOOD LOSS: 600mL  DRAINS: 150mL UOP via indwelling foley  TOTAL IV FLUIDS: 2300mL crystalloid  VTE Prophylaxis: SCD  ANTIBIOTICS: 2g IV Cefazolin and 500mg  IV Azithromycin, within 1 hour of skin incision  SPECIMENS: None  COMPLICATIONS: None  INDICATIONS: Repeat cesarean with premature rupture of membranes.  FINDINGS: Minimal intra-abdominal adhesions were noted. Grossly normal uterus, tubes and ovaries. Clear amniotic fluid prior to cesarean, cephalic female infant, weight 2535g or 5#9.4oz, APGARs 9/9, intact placenta.  PROCEDURE IN DETAIL: The patient was taken to the operating room where anesthesia was administered and normal fetal heart tones were confirmed. She was then prepped and draped in the normal fashion in the dorsal supine position with a leftward tilt.  After a time out was performed, a pfannensteil skin incision was made with the scalpel and carried through to the underlying layer of fascia. The fascia was then incised at the midline and this incision was extended laterally with the mayo scissors. Attention was turned to the superior aspect of the fascial incision which was grasped with the kocher clamps x 2, tented up and the rectus muscles were dissected off with the scalpel. In a similar fashion the inferior aspect of the fascial incision was grasped with the kocher clamps, tented up and the rectus muscles dissected off with the mayo scissors. The rectus muscles were then separated in the midline and the peritoneum was entered bluntly.  Nesacaine was lavaged into the intraperitoneal cavity. The bladder  blade was inserted and the vesicouterine peritoneum was identified, tented up and entered with the metzenbaum scissors. This incision was extended laterally and the bladder flap was created digitally. The bladder blade was reinserted.  A low transverse hysterotomy was made with the scalpel until the endometrial cavity was breached. This incision was extended bluntly and the infant's head with a loose nuchal cord easily reduced, shoulders and body were delivered atraumatically.The cord was clamped x 2 and cut, and the infant was handed to the awaiting pediatricians, after delayed cord clamping was done for 1 minute.  The placenta was then gradually expressed from the uterus and then the uterus was exteriorized and cleared of all clots and debris. The hysterotomy was repaired with a running suture of 1-0 Vicryl. A second imbricating layer of 1-0 Vicryl suture was then placed. Several figure-of-eight sutures of 1-0 Vicryl were added to achieve excellent hemostasis.   The uterus and adnexa were then returned to the abdomen, and the hysterotomy and all operative sites were reinspected and excellent hemostasis was noted after irrigation and suction of the abdomen with warm saline.  The peritoneum was closed with a running stitch of 1-0 Vicryl. The fascia was reapproximated with 0 Vicryl in a simple running fashion. The subcutaneous layer was then reapproximated with interrupted sutures of 2-0 plain gut, and the skin was then closed with 4-0 monocryl, in a subcuticular fashion.  The patient  tolerated the procedure well. Sponge, lap, needle, and instrument counts were correct x 2. The patient was transferred to the recovery room awake, alert and breathing independently in stable condition.    Cleda ClarksElizabeth W Ellianah Cordy, DO OB Fellow [facility] Mid[facility](Faculty Practice)

## 2016-05-23 NOTE — Addendum Note (Signed)
Addendum  created 05/23/16 1650 by Orlie Pollenebra R Norissa Bartee, CRNA   Modules edited: Clinical Notes   Clinical Notes:  File: [id number]; Pend: [id number]

## 2016-05-23 NOTE — MAU Note (Signed)
Phone call to OR coordinator, circulator and anes.  House had called nursery

## 2016-05-23 NOTE — Anesthesia Procedure Notes (Signed)

## 2016-05-23 NOTE — MAU Note (Signed)
Pt reports ROM at 0530, denies pain.

## 2016-05-24 DIAGNOSIS — D62 Acute posthemorrhagic anemia: Secondary | ICD-10-CM

## 2016-05-24 LAB — CBC
HEMATOCRIT: 22.8 % — AB (ref 36.0–46.0)
Hemoglobin: 7.8 g/dL — ABNORMAL LOW (ref 12.0–15.0)
MCH: 30.6 pg (ref 26.0–34.0)
MCHC: 34.2 g/dL (ref 30.0–36.0)
MCV: 89.4 fL (ref 78.0–100.0)
PLATELETS: 96 10*3/uL — AB (ref 150–400)
RBC: 2.55 MIL/uL — ABNORMAL LOW (ref 3.87–5.11)
RDW: 15.2 % (ref 11.5–15.5)
WBC: 18.3 10*3/uL — AB (ref 4.0–10.5)

## 2016-05-24 LAB — BIRTH TISSUE RECOVERY COLLECTION (PLACENTA DONATION)

## 2016-05-24 LAB — RPR: RPR Ser Ql: NONREACTIVE

## 2016-05-24 LAB — HIV ANTIBODY (ROUTINE TESTING W REFLEX): HIV SCREEN 4TH GENERATION: NONREACTIVE

## 2016-05-24 MED ORDER — FERROUS SULFATE 325 (65 FE) MG PO TABS
325.0000 mg | ORAL_TABLET | Freq: Two times a day (BID) | ORAL | Status: DC
  Administered 2016-05-24 – 2016-05-26 (×5): 325 mg via ORAL
  Filled 2016-05-24 (×6): qty 1

## 2016-05-24 NOTE — Clinical Social Work Maternal (Addendum)
Patient Information   Patient Name Sex DOB SSN  Veronica [facility], Veronica Blackwell Female August 24, 1981 [id number]  Clinical Social Work Maternal by Veronica Nanas, LCSW at 05/24/2016 1:45 PM   Author: Dimple Nanas, LCSW Service: CASE MANAGEMENT Author Type: Social Worker  Filed: 05/24/2016 1:57 PM Note Time: 05/24/2016 1:45 PM Status: Signed  Editor: Veronica Nanas, LCSW (Social Worker)    Expand All Collapse All     CLINICAL SOCIAL WORK MATERNAL/CHILD NOTE  Patient Details  Name: Veronica [facility] MRN: [id number] Date of Birth: [birth year]  Date: 05/24/2016  Clinical Social Worker Initiating Note: Veronica Haring Boyd-GilyardDate/ Time Initiated: 05/24/16/1149   Child's Name: Veronica [facility]   Legal Guardian: Mother   Need for Interpreter: None   Date of Referral: 05/24/16   Reason for Referral:     Referral Source: [facility]   Address: [address] [place] [address]  Phone number: [phone]   Household Members: Self, Minor Children, Significant Other   Natural Supports (not living in the home): Immediate Family, Parent, Spouse/significant other, Friends   Arts administrator (Baby Love Case Worker)   Employment:Unemployed   Type of Work:     Education:     Museum/gallery curator Resources:Medicaid   Other Resources: Physicist, medical , [employer] Cultural/Religious Considerations Which May Impact Care: Per [employer] Presenter, broadcasting, [name] is Veronica [facility]well  Strengths: Ability to meet basic needs , Home prepared for child    Risk Factors/Current Problems: Substance Use    Cognitive State: Alert , Linear Thinking    Mood/Affect: Relaxed , Calm , Interested , Happy    CSW Assessment:CSW met with [name] to complete an assessment for a consult for hx of THC use in pregnancy. [name] was polite and was receptive with meeting with CSW. [name] introduced her room guest as FOB Education officer, environmental). [name] gave CSW permission to  speak with [name] while FOB was present. CSW inquired about [name]'s substance use, and [name] reported that [name] utilized cocaine, marijuana, and opiates while pregnancy. CSW thanked [name] for her honesty and informed [name] of the [facility] drug screen policy. CSW was made aware of the 2 screenings for the infant. [name] was understanding and communicated that her pervious OBGYN office [facility]) had made a report to [facility] earlier during [name]'s pregnancy. CSW explained that CSW reviewed infant's UDS, and it had a negative result. CSW informed [name] that CSW will monitor the infant's cord; however, since [name] acknowledged she used illegal substance and [name]'s past drug screens have been positive, CSW will be making a report to [facility]. CSW offered [name] resources and referrals to [facility] for substance interventions, and [name] declined. [name] did not have any questions about the [facility] policy. CSW educated [name] about PPD. CSW informed [name] of possible supports and interventions to decrease PPD. CSW also encouraged [name] to seek medical attention if needed for increased signs and symptoms for PPD. CSW reviewed safe sleep, and SIDS. [name] was knowledgeable and asked appropriate questions. [name] communicated that she has a pack n play for the baby and is prepared to meet her infant's needs. [name] did not have any further questions, concerns, or needs at this time, and CSW thanked [name] for allowing CSW to meet with [name].  CSW Plan/Description: Child Protective Service Report , No Further Intervention Required/No Barriers to Discharge, Patient/Family Education , Psychosocial Support and Ongoing Assessment of Needs (Report made to [facility] for drug expose infant.)    Veronica Nanas, LCSW 05/24/2016,  1:45 PM

## 2016-05-24 NOTE — Clinical Social Work Maternal (Signed)
  CLINICAL SOCIAL WORK MATERNAL/CHILD NOTE  Patient Details  Name: Veronica [facility] MRN: [id number] Date of Birth: August 26, 1981  Date:  05/24/2016  Clinical Social Worker Initiating Note:  Laurey Arrow Date/ Time Initiated:  05/24/16/1149     Child's Name:  Arlean Hopping Deal   Legal Guardian:  Mother   Need for Interpreter:  None   Date of Referral:  05/24/16     Reason for Referral:      Referral Source:  Central [facility]   Address:  [address] [place] [address]  Phone number:  [phone]   Household Members:  Self, Minor Children, Significant Other   Natural Supports (not living in the home):  Immediate Family, Parent, Spouse/significant other, Friends   Professional Supports: Case Metallurgist (Baby Love Science writer)   Employment: Unemployed   Type of Work:     Education:      Pensions consultant:  [employer]   Other Resources:  Physicist, medical , [place] Considerations Which May Impact Care:  Per [employer] Presenter, broadcasting, [name] is Manufacturing systems engineer:  Ability to meet basic needs , Home prepared for child    Risk Factors/Current Problems:  Substance Use    Cognitive State:  Alert , Linear Thinking    Mood/Affect:  Relaxed , Calm , Interested , Happy    CSW Assessment: CSW met with [name] to complete an assessment for a consult for hx of THC use in pregnancy.  [name] was polite and was receptive with meeting with CSW.  [name] introduced her room guest as FOB Education officer, environmental).  [name] gave CSW permission to speak with [name] while FOB was present. CSW inquired about [name]'s substance use, and [name] reported that [name] utilized cocaine, marijuana, and opiates while pregnancy. CSW thanked [name] for her honesty and informed [name] of the hospital's drug screen policy. CSW was made aware of the 2 screenings for the infant.  [name] was understanding and communicated that her pervious OBGYN office [facility]) had made a report to [place] earlier during [name]'s  pregnancy. CSW explained that CSW reviewed infant's UDS, and it had a negative result.  CSW informed [name] that CSW will monitor the infant's cord; however, since [name] acknowledged she used illegal substance and [name]'s past drug screens have been positive, CSW will be making a report to [place]. CSW offered [name] resources and referrals to [facility] for substance interventions, and [name] declined.  [name] did not have any questions about the hospital's policy. CSW educated [name] about PPD. CSW informed [name] of possible supports and interventions to decrease PPD.  CSW also encouraged [name] to seek medical attention if needed for increased signs and symptoms for PPD. CSW reviewed safe sleep, and SIDS. [name] was knowledgeable and asked appropriate questions.  [name] communicated that she has a pack n play for the baby and is prepared to meet her infant's needs.  [name] did not have any further questions, concerns, or needs at this time, and CSW thanked [name] for allowing CSW to meet with [name].  CSW Plan/Description:  Child Protective Service Report , No Further Intervention Required/No Barriers to Discharge, Patient/Family Education , Psychosocial Support and Ongoing Assessment of Needs (Report made to [place] for drug expose infant.)    ANGEL D BOYD-GILYARD, LCSW 05/24/2016, 1:45 PM

## 2016-05-24 NOTE — Progress Notes (Signed)
CM / UR chart review completed.  

## 2016-05-24 NOTE — Anesthesia Postprocedure Evaluation (Signed)
Anesthesia Post Note  Patient: Veronica [facility]  Procedure(s) Performed: Procedure(s) (LRB): CESAREAN SECTION (N/A)  Patient location during evaluation: Mother Baby Anesthesia Type: Spinal Level of consciousness: awake, awake and alert and oriented Pain management: pain level controlled Vital Signs Assessment: post-procedure vital signs reviewed and stable Respiratory status: spontaneous breathing, nonlabored ventilation and respiratory function stable Cardiovascular status: stable Postop Assessment: no headache, no backache, patient able to bend at knees, no signs of nausea or vomiting and adequate PO intake Anesthetic complications: no     Last Vitals:  Filed Vitals:   05/24/16 0424 05/24/16 0915  BP: 96/47 104/60  Pulse: 82 68  Temp: 36.7 C 36.7 C  Resp: 18 18    Last Pain:  Filed Vitals:   05/24/16 1213  PainSc: 6    Pain Goal: Patients Stated Pain Goal: 3 (05/23/16 1400)               Sereen Schaff

## 2016-05-24 NOTE — Progress Notes (Signed)
POSTPARTUM PROGRESS NOTE  Postoperative Day 1 Subjective:  Epimenio Sarinlarki B Welcher is a 35 y.o. G3P3003 569w5d s/p RLTCS for PROM.  No acute events overnight.  Pt denies problems with ambulating, voiding or po intake.  She denies nausea or vomiting.  Pain is moderately controlled.  She has had flatus. She has not had bowel movement.  Lochia Moderate.   Objective: Blood pressure 96/47, pulse 82, temperature 98 F (36.7 C), temperature source Oral, resp. rate 18, last menstrual period 08/18/2015, SpO2 100 %, unknown if currently breastfeeding.  Physical Exam:  General: alert, cooperative and no distress Lochia:normal flow Chest: CTAB Heart: RRR no m/r/g Abdomen: +BS, soft, nontender, incision c/d/i with honeycomb in place Uterine Fundus: firm, minimally tender DVT Evaluation: No calf swelling or tenderness Extremities: trace edema   Recent Labs  05/23/16 0735 05/24/16 0540  HGB 11.2* 7.8*  HCT 32.7* 22.8*    Assessment/Plan:  ASSESSMENT: Epimenio Sarinlarki B [place] is a 35 y.o. G3P3003 729w5d s/p RLTCS PROM doing well. Monitor pain and await social work recommendations.   Plan for discharge tomorrow and Contraception nexplanon   LOS: 1 day   Ernestina Pennaicholas Alison Kubicki 05/24/2016, 7:54 AM

## 2016-05-24 NOTE — Clinical Documentation Improvement (Signed)
[facility]  Please document query responses in the progress notes and discharge summary, not on the CDI BPA form in [facility]. Thank you!  Please document if a condition below provides greater specificity regarding the patient's CBC results this admission:  Possible Clinical Conditions  - Acute Blood Loss Anemia  - Other type of Anemia  - Other condition  - Unable to clinically determine  Clinical Information: Baseline H&H has dropped from 11.2 mg/dl preop to 7.8 mg/dL postop IV fluids received intraoperatively - 2300 ml LR per anesthesia record EBL 600 ml per anesthesia record Current medications include Ferrous Sulfate 325 mg bid   Please exercise your independent, professional judgment when responding. A specific answer is not anticipated or expected.   Thank You, Jerral Ralphathy R Blondie Riggsbee  RN BSN CCDS [phone] Health Information Management [place]

## 2016-05-24 NOTE — Addendum Note (Signed)
Addendum  created 05/24/16 1331 by Elbert Ewingsolleen S Kellan Raffield, CRNA   Modules edited: Clinical Notes   Clinical Notes:  File: [id number]

## 2016-05-25 ENCOUNTER — Encounter: Payer: Self-pay | Admitting: Family

## 2016-05-25 MED ORDER — ACETAMINOPHEN 325 MG PO TABS
650.0000 mg | ORAL_TABLET | ORAL | Status: DC | PRN
  Administered 2016-05-25 – 2016-05-26 (×5): 650 mg via ORAL
  Filled 2016-05-25 (×5): qty 2

## 2016-05-25 NOTE — Progress Notes (Signed)
Post-c/s day 2 Subjective: up ad lib, voiding, tolerating PO and + flatus   Pt reports that her pain is "almost always an 8" and today her pain is mostly located along her incision site and in her back from lying down. Reports minimal vaginal bleeding. Pt denies sx of anemia such as dizziness or shortness of breath. She is eager to discharge however she will stay with the baby who will remain for further evaluation for NAS.  Objective: Blood pressure 98/56, pulse 82, temperature 97.9 F (36.6 C), temperature source Oral, resp. rate 18, last menstrual period 08/18/2015, SpO2 100 %, unknown if currently breastfeeding.  Physical Exam:  General: alert, cooperative and mild distress Lochia: appropriate Uterine Fundus: firm Incision: no significant drainage, no significant erythema, honeycomb bandage in place DVT Evaluation: No evidence of DVT seen on physical exam.   Recent Labs  05/23/16 0735 05/24/16 0540  HGB 11.2* 7.8*  HCT 32.7* 22.8*    Assessment/Plan: Plan for discharge tomorrow. Baby will be monitored for a total of 72 hrs by peds due to maternal opiate use during pregnancy.   Prior to d/c assess pt again for sx of anemia and encourage iron supplementation. Current meds include Ferrous Sulfate 325 bid  Pain mgmt is complicated by pt's chronic substance abuse. Caution against use of narcotics for outpt pain mgmt. Use of Ibuprofen and acetaminophen encouraged.    LOS: 2 days   Andres Egericia Valecia Beske, MD, PGY-1, MPH 05/25/2016, 3:08 PM

## 2016-05-26 MED ORDER — FERROUS SULFATE 325 (65 FE) MG PO TABS: 325.0000 mg | ORAL_TABLET | Freq: Two times a day (BID) | ORAL | Status: DC

## 2016-05-26 MED ORDER — OXYCODONE HCL 5 MG PO TABS: 5.0000 mg | ORAL_TABLET | ORAL | Status: DC | PRN

## 2016-05-26 NOTE — Discharge Instructions (Signed)
Cesarean Delivery, Care After  Refer to this sheet in the next few weeks. These instructions provide you with information on caring for yourself after your procedure. Your health care provider may also give you specific instructions. Your treatment has been planned according to current medical practices, but problems sometimes occur. Call your health care provider if you have any problems or questions after you go home.  HOME CARE INSTRUCTIONS   Only take over-the-counter or prescription medications as directed by your health care provider.   Do not drink alcohol, especially if you are breastfeeding or taking medication to relieve pain.   Do not chew or smoke tobacco.   Continue to use good perineal care. Good perineal care includes:    Wiping your perineum from front to back.    Keeping your perineum clean.   Check your surgical cut (incision) daily for increased redness, drainage, swelling, or separation of skin.   Clean your incision gently with soap and water every day, and then pat it dry. If your health care provider says it is okay, leave the incision uncovered. Use a bandage (dressing) if the incision is draining fluid or appears irritated. If the adhesive strips across the incision do not fall off within 7 days, carefully peel them off.   Hug a pillow when coughing or sneezing until your incision is healed. This helps to relieve pain.   Do not use tampons or douche until your health care provider says it is okay.   Shower, wash your hair, and take tub baths as directed by your health care provider.   Wear a well-fitting bra that provides breast support.   Limit wearing support panties or control-top hose.   Drink enough fluids to keep your urine clear or pale yellow.   Eat high-fiber foods such as whole grain cereals and breads, brown rice, beans, and fresh fruits and vegetables every day. These foods may help prevent or relieve constipation.   Resume activities such as climbing stairs,  driving, lifting, exercising, or traveling as directed by your health care provider.   Talk to your health care provider about resuming sexual activities. This is dependent upon your risk of infection, your rate of healing, and your comfort and desire to resume sexual activity.   Try to have someone help you with your household activities and your newborn for at least a few days after you leave the hospital.   Rest as much as possible. Try to rest or take a nap when your newborn is sleeping.   Increase your activities gradually.   Keep all of your scheduled postpartum appointments. It is very important to keep your scheduled follow-up appointments. At these appointments, your health care provider will be checking to make sure that you are healing physically and emotionally.  SEEK MEDICAL CARE IF:    You are passing large clots from your vagina. Save any clots to show your health care provider.   You have a foul smelling discharge from your vagina.   You have trouble urinating.   You are urinating frequently.   You have pain when you urinate.   You have a change in your bowel movements.   You have increasing redness, pain, or swelling near your incision.   You have pus draining from your incision.   Your incision is separating.   You have painful, hard, or reddened breasts.   You have a severe headache.   You have blurred vision or see spots.   You feel sad   or depressed.   You have thoughts of hurting yourself or your newborn.   You have questions about your care, the care of your newborn, or medications.   You are dizzy or light-headed.   You have a rash.   You have pain, redness, or swelling at the site of the removed intravenous access (IV) tube.   You have nausea or vomiting.   You stopped breastfeeding and have not had a menstrual period within 12 weeks of stopping.   You are not breastfeeding and have not had a menstrual period within 12 weeks of delivery.   You have a fever.  SEEK  IMMEDIATE MEDICAL CARE IF:   You have persistent pain.   You have chest pain.   You have shortness of breath.   You faint.   You have leg pain.   You have stomach pain.   Your vaginal bleeding saturates 2 or more sanitary pads in 1 hour.  MAKE SURE YOU:    Understand these instructions.   Will watch your condition.   Will get help right away if you are not doing well or get worse.     This information is not intended to replace advice given to you by your health care provider. Make sure you discuss any questions you have with your health care provider.     Document Released: 07/16/2002 Document Revised: 11/14/2014 Document Reviewed: 06/20/2012  Elsevier Interactive Patient Education 2016 [employer]

## 2016-05-26 NOTE — Discharge Summary (Signed)
OB Discharge Summary     Patient Name: Veronica Blackwell DOB: 24-Sep-1981 MRN: [id number]  Date of admission: 05/23/2016 Delivering MD: Veronica Blackwell   Date of discharge: 05/26/2016  Admitting diagnosis: 39 WEEKS ROM  Intrauterine pregnancy: [redacted]w[redacted]d     Secondary diagnosis:  Active Problems:   Cesarean delivery delivered   Acute blood loss anemia polysubstance abuse  Additional problems: none     Discharge diagnosis: Term Pregnancy Delivered                                                                                                Post partum procedures: none  Augmentation:  n/a  Complications: None  Hospital course:  Sceduled C/S   35 y.o. yo G3P3003 at [redacted]w[redacted]d was admitted to the hospital 05/23/2016 with prom, with plan for repeat c/s, so taken for c/s. Membrane Rupture Time/Date: 5:30 AM ,05/23/2016   Patient delivered a Viable infant.05/23/2016  Details of operation can be found in separate operative note.  Pateint had an uncomplicated postpartum course.  She is ambulating, tolerating a regular diet, passing flatus, and urinating well. Patient is discharged home in stable condition on  05/26/2016        H 7.8 from 11.2, asymptomatic, started on oral iron. Polysubstance abuse, SW saw, no barriers to mother's discharge.   Physical exam  Filed Vitals:   05/25/16 0605 05/25/16 1855 05/25/16 2335 05/26/16 0639  BP: 98/56 113/61  109/64  Pulse: 82 96  74  Temp: 97.9 F (36.6 C) 98.7 F (37.1 C)    TempSrc: Oral     Resp: Height:    (1.549 m)   Weight:   169 lb (76.658 kg)   SpO2:       General: alert, cooperative and no distress Lochia: appropriate Uterine Fundus: firm Incision: Healing well with no significant drainage, No significant erythema DVT Evaluation: No cords or calf tenderness. No significant calf/ankle edema. Labs: Lab Results  Component Value Date   WBC 18.3* 05/24/2016   HGB 7.8* 05/24/2016   HCT 22.8* 05/24/2016   MCV 89.4  05/24/2016   PLT 96* 05/24/2016   CMP Latest Ref Rng 03/30/2016  Glucose 65 - 104 mg/dL 75  BUN 6 - 20 mg/dL -  Creatinine 5.63 - 8.75 mg/dL -  Sodium 643 - 329 mmol/L -  Potassium 3.5 - 5.1 mmol/L -  Chloride 101 - 111 mmol/L -  CO2 22 - 32 mmol/L -  Calcium 8.9 - 10.3 mg/dL -  Total Protein 6.5 - 8.1 g/dL -  Total Bilirubin 0.3 - 1.2 mg/dL -  Alkaline Phos 38 - 518 U/L -  AST 15 - 41 U/L -  ALT 14 - 54 U/L -    Discharge instruction: per After Visit Summary and "Baby and Me Booklet".  After visit meds:    Medication List    STOP taking these medications        HYDROcodone-acetaminophen 5-325 MG tablet  Commonly known as:  NORCO/VICODIN     ranitidine 150 MG tablet  Commonly known as:  ZANTAC  valACYclovir 500 MG tablet  Commonly known as:  VALTREX      TAKE these medications        acetaminophen 325 MG tablet  Commonly known as:  TYLENOL  Take 2 tablets (650 mg total) by mouth every 4 (four) hours as needed.     ferrous sulfate 325 (65 FE) MG tablet  Take 1 tablet (325 mg total) by mouth 2 (two) times daily with a meal.     oxyCODONE 5 MG immediate release tablet  Commonly known as:  ROXICODONE  Take 1 tablet (5 mg total) by mouth every 4 (four) hours as needed for severe pain.     prenatal multivitamin Tabs tablet  Take 1 tablet by mouth daily at 12 noon.        Diet: routine diet  Activity: Advance as tolerated. Pelvic rest for 6 weeks.   Outpatient follow up:6 weeks Follow up Appt:Future Appointments Date Time Provider Department Center  07/06/2016 3:20 PM Veronica PhenixJames G Arnold, MD [facility]   Follow up Visit:No Follow-up on file.  Postpartum contraception: Nexplanon  Newborn Data: Live born female  Birth Weight: 5 lb 9.4 oz (2535 g) APGAR: 9, 9  Baby Feeding: Bottle Disposition: pending   05/26/2016 Veronica BilisNoah B Avril Busser, MD

## 2016-05-27 ENCOUNTER — Encounter (HOSPITAL_COMMUNITY): Admission: RE | Admit: 2016-05-27 | Payer: Medicaid Other | Source: Ambulatory Visit

## 2016-05-28 ENCOUNTER — Encounter (HOSPITAL_COMMUNITY): Admission: RE | Payer: Self-pay | Source: Ambulatory Visit

## 2016-05-28 ENCOUNTER — Inpatient Hospital Stay (HOSPITAL_COMMUNITY): Admission: RE | Admit: 2016-05-28 | Payer: Medicaid Other | Source: Ambulatory Visit | Admitting: Family Medicine

## 2016-05-28 SURGERY — Surgical Case
Anesthesia: Regional

## 2016-05-30 NOTE — Progress Notes (Signed)
CSW reported infant's positive cord results to [facility] CPS and to the infants pediatrician's office. Infant's cord screen was positive for THC and Noroxycodone.  [name] has a prescription for oxycodone  which will result in a positive screen for Noroxycodone.

## 2016-06-03 ENCOUNTER — Telehealth: Payer: Self-pay | Admitting: *Deleted

## 2016-06-03 NOTE — Telephone Encounter (Signed)
Received a message left on the nurse voicemail on 06/02/16 at 1322.  Patient states she had a C/S on 05/23/16.  States she is out of pain medication and would like a refill.  Requests a return call to [phone].

## 2016-06-03 NOTE — Telephone Encounter (Signed)
I called Veronica Blackwell and she states she had c/s 7/17 and has used her oxycodone and pain is a 6. I asked if she is using ibuprofen and she states she is and it does not help. She states her incision is ok , no redness, is intact, no drainage. I advised her to come to [facility] for evaluation if she feels her pain is unrelieved by motrin and that we expect there to be some pain , but should be tolerable at this point and if it is not to come to [facility] for evaluation. She voices understanding.

## 2016-06-06 NOTE — Telephone Encounter (Signed)
Received message left on nurse line on 06/03/16 at 1259.  Patient states she needs refill on pain medication and requests a return call to [phone].

## 2016-06-25 IMAGING — US US MFM OB DETAIL+14 WK
1 series · 13 of 28 positions shown · non-contrast
Comparison: none

[Series 1: us mfm ob detail+14 wk · 13 of 89 slices shown]
[im 4/89]
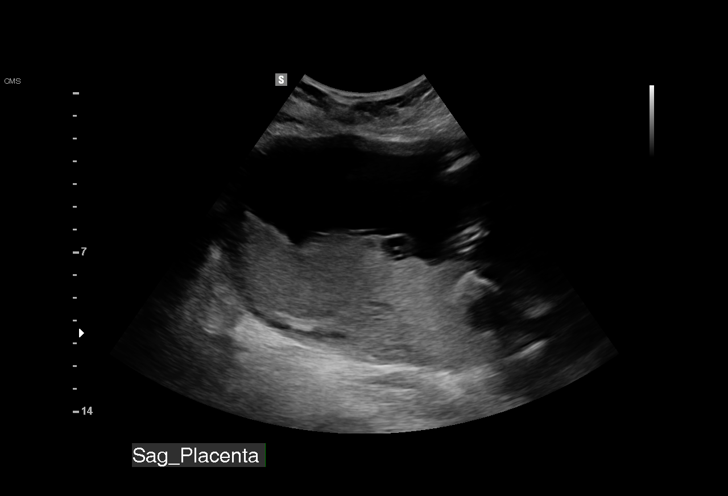
[im 10/89]
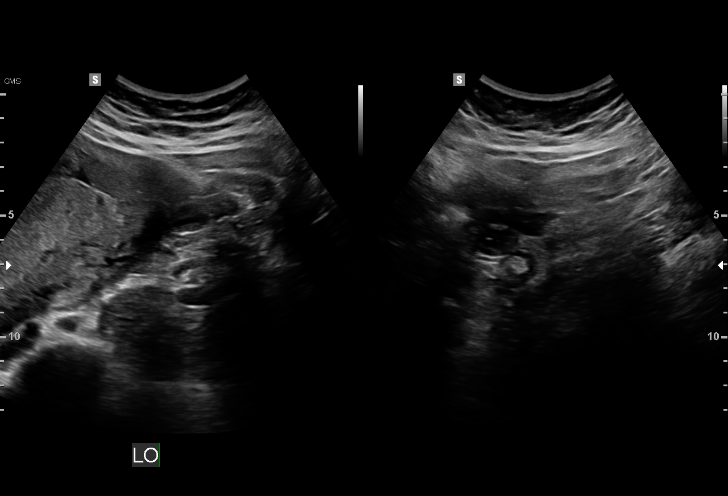
[im 17/89]
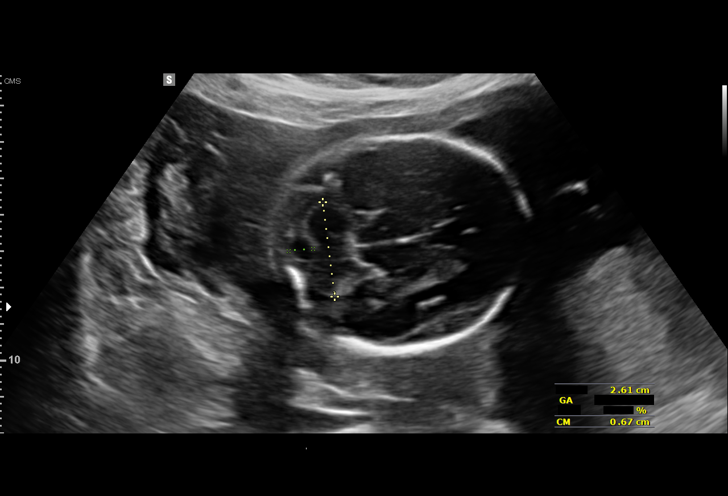
[im 23/89]
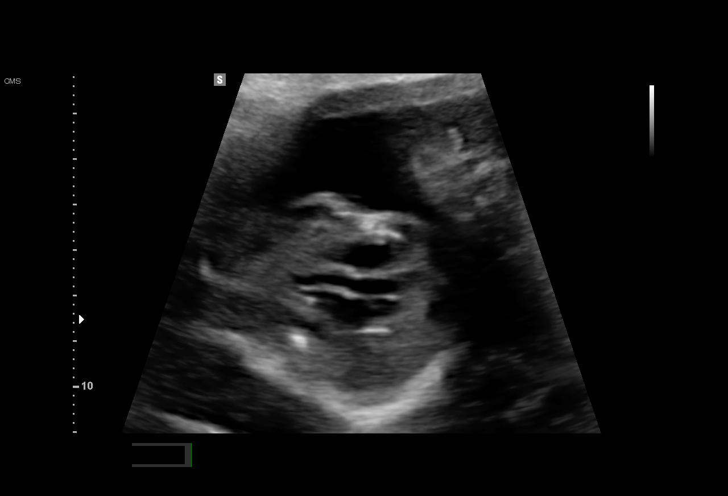
[im 30/89]
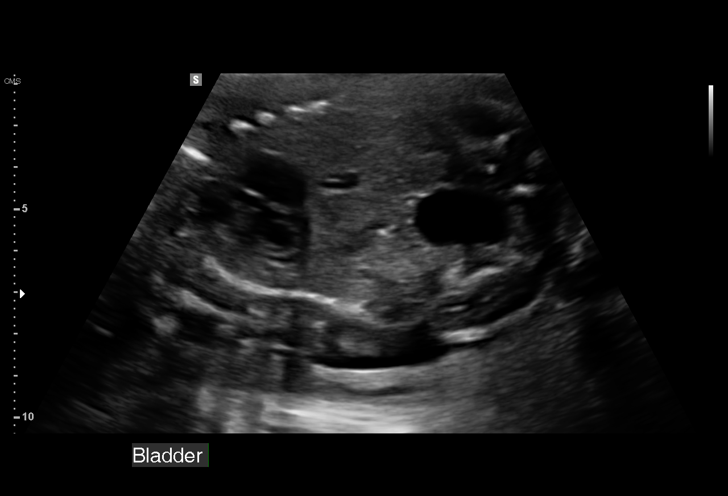
[im 36/89]
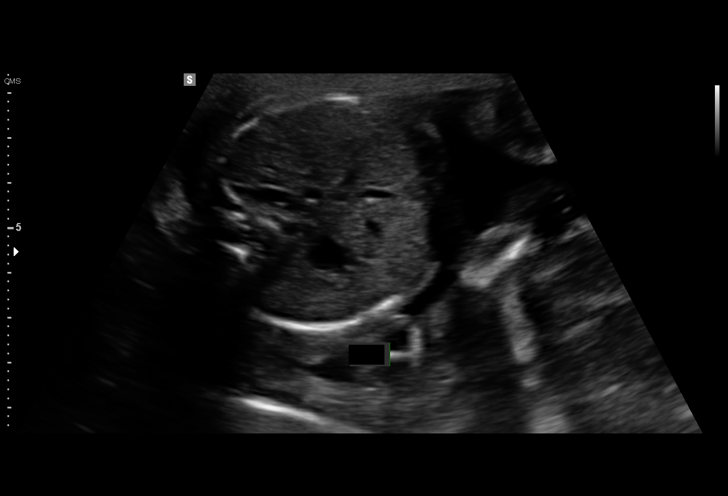
[im 46/89]
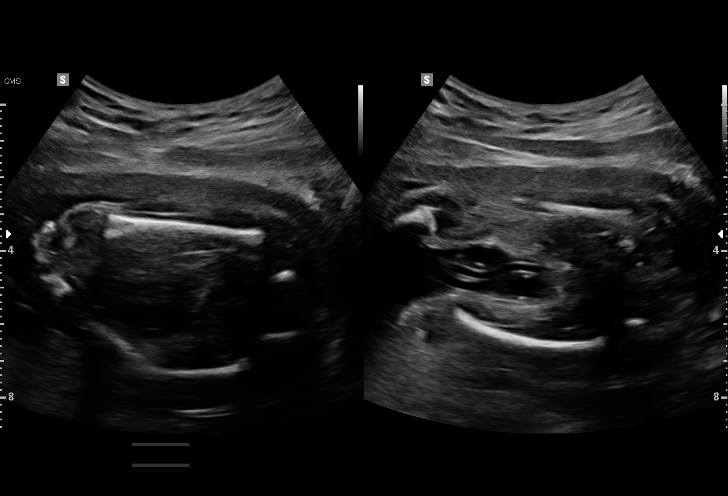
[im 53/89]
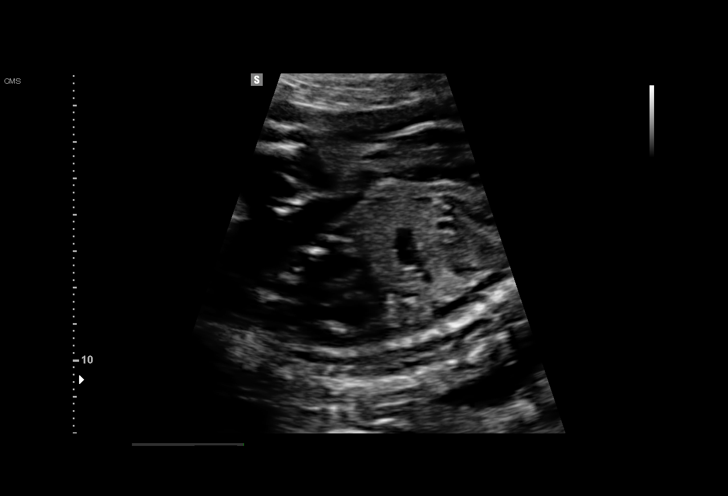
[im 59/89]
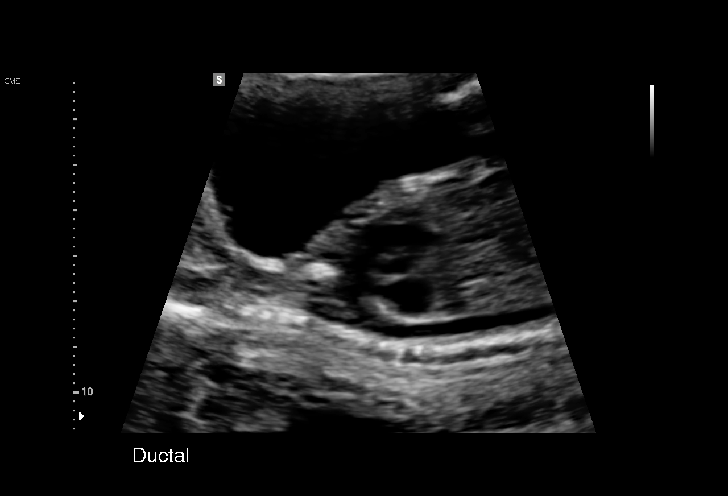
[im 66/89]
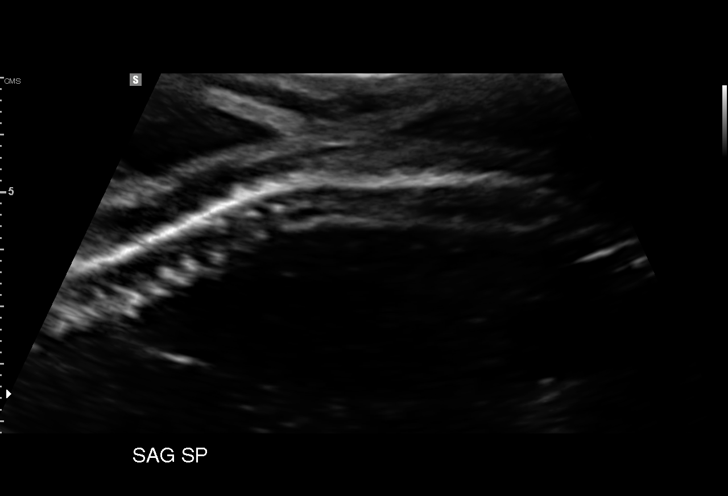
[im 72/89]
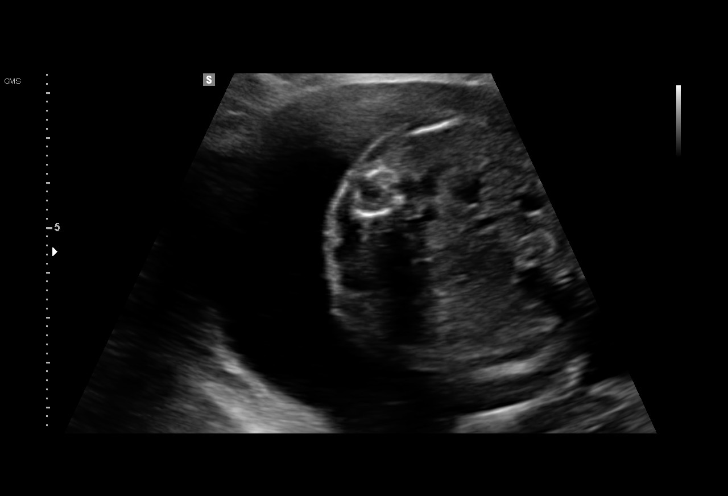
[im 79/89]
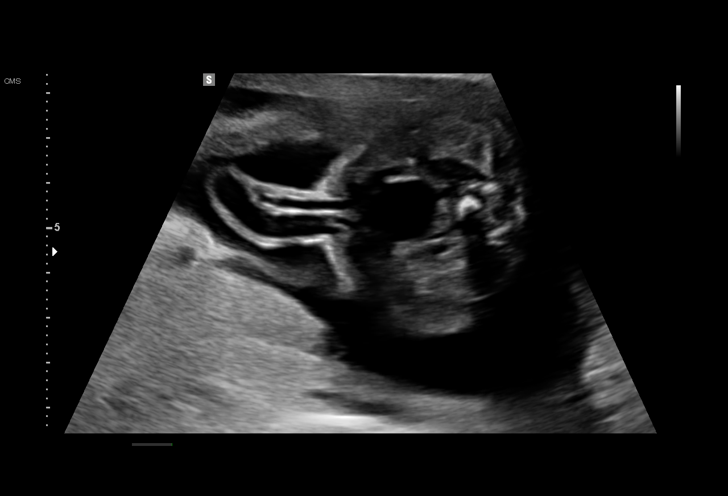
[im 85/89]
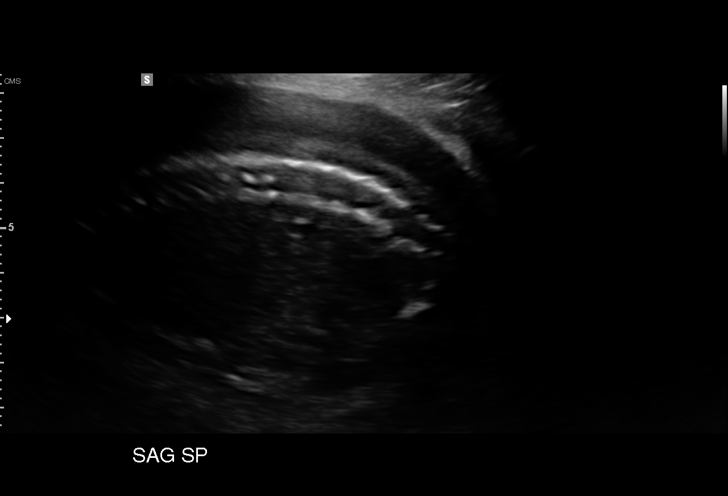

[13 of 28 positions shown; findings below may reference images not displayed]

OB/Gyn Clinic
[REDACTED]-
Faculty Physician

1  QOCALIQ SEDOY                [id number]      [phone]     [phone]
Indications

23 weeks gestation of pregnancy
Detailed fetal anatomic survey                 Z36
Abnormal biochemical screen (quad) for
Trisomy 18
Previous cesarean delivery, antepartum x 2
Drug use complicating pregnancy, second
trimester (+UDS cocaine, THC, opiates)
OB History

Gravidity:    3         Term:   2        Prem:   0        SAB:   0
TOP:          0       Ectopic:  0        Living: 2
Fetal Evaluation

Num Of Fetuses:     1
Fetal Heart         138
Rate(bpm):
Cardiac Activity:   Observed
Presentation:       Breech
Placenta:           Posterior, above cervical os
P. Cord Insertion:  Visualized, central

Amniotic Fluid
AFI FV:      Subjectively within normal limits
Larg Pckt:   4.99   cm
Biometry

BPD:      58.8  mm     G. Age:  24w 0d                  CI:        71.24   %   70 - 86
FL/HC:      19.0   %   18.7 -
HC:      221.9  mm     G. Age:  24w 1d         47  %    HC/AC:      1.19       1.05 -
AC:      186.2  mm     G. Age:  23w 3d         28  %    FL/BPD:     71.6   %   71 - 87
FL:       42.1  mm     G. Age:  23w 5d         34  %    FL/AC:      22.6   %   20 - 24
HUM:      39.7  mm     G. Age:  24w 1d         50  %
CER:      26.1  mm     G. Age:  24w 0d         53  %
CM:        6.7  mm

Est. FW:     614  gm      1 lb 6 oz     48  %
Gestational Age

LMP:           25w 0d       Date:   08/18/15                 EDD:   05/24/16
U/S Today:     23w 6d                                        EDD:   06/01/16
Best:          23w 6d    Det. By:   Early Ultrasound         EDD:   06/01/16
Anatomy

Cranium:          Appears normal         Aortic Arch:      Appears normal
Fetal Cavum:      Appears normal         Ductal Arch:      Appears normal
Ventricles:       Appears normal         Diaphragm:        Appears normal
Choroid Plexus:   Appears normal         Stomach:          Appears normal, left
sided
Cerebellum:       Appears normal         Abdomen:          Appears normal
Posterior Fossa:  Appears normal         Abdominal Wall:   Appears nml (cord
insert, abd wall)
Nuchal Fold:      Not applicable (>20    Cord Vessels:     Appears normal (3
wks GA)                                  vessel cord)
Face:             Appears normal         Kidneys:          Left UTD
(orbits and profile)
Lips:             Appears normal         Bladder:          Appears normal
Fetal Thoracic:   Appears normal         Spine:            Appears normal
Heart:            Appears normal         Upper             Appears normal
(4CH, axis, and        Extremities:
situs)
RVOT:             Appears normal         Lower             Appears normal
Extremities:
LVOT:             Appears normal

Other:  Fetus appears to be a male. Heels visualized. Right 5th digit
visualized. Nasal bone visualized.
Cervix Uterus Adnexa

Cervix
Length:              4  cm.
Normal appearance by transabdominal scan.

Uterus
No abnormality visualized.

Left Ovary
Within normal limits.

Right Ovary
Not visualized.
Cul De Sac:   No free fluid seen.
Adnexa:       No abnormality visualized.
Comments

Mild urinary tract (UT) dilation, formerly known as pyelectasis,
was identified. Isolated UT dilation is observed in about 1 - 2
% of pregnancies at around 18-20 weeks of gestation. Most
instances are of no pathological significance. Classification is
based upon AP renal pelvic diameter, calyceal dilation, renal
parenchymal thickness and appearance, bladder
abnormalities, and ureteral abnormalities. Based upon
today's images, the renal pelvis AP diameter measurement of
5-6mm for the left, presence of normal renal parenchyma and
calyceal architecture with dilation constitute UT dilation as an
indication for reassessment around 30-32 weeks. Up to 85%
of cases identified as measuring at 4mm or more at 18-20
weeks will measure less than 7mm at 30-32 weeks and
require no further follow up. The remainder that progress in
dilation and do measure in excess of 7mm of dilation will
have follow up arranged by our unit as felt appropriate by the
evaluating perinatologist.
Targeted examination of the remaining fetal anatomy was
performed and no other dysmorphic features, or morphologic
"soft markers" associated with aneuploidy, were detected.
When other abnormalities are present there is a significantly
increased risk of attendant chromosomal defects, usually
trisomy 21. However, in cases of isolated pyelectasis there
remains some controversy about the true risk adjustment for
trisomy 21, with investigators reporting 0 to 1.5-fold increase
over the background risk.

Impression

SIUP at 97w3d, drug use
EFW 48th%
Apparently isolated left UTD
High risk for T18 by msQUAD
no previa
Recommendations

1. see genetic counseling
2. Patient declined amniocentesis, but she opted for
Panorama
3. f/u interval growth and reevaluation of UTD in 6 weeks.

## 2016-07-06 ENCOUNTER — Ambulatory Visit: Payer: Self-pay | Admitting: Obstetrics & Gynecology

## 2016-08-02 ENCOUNTER — Ambulatory Visit (INDEPENDENT_AMBULATORY_CARE_PROVIDER_SITE_OTHER): Payer: Medicaid Other | Admitting: Advanced Practice Midwife

## 2016-08-02 ENCOUNTER — Encounter: Payer: Self-pay | Admitting: Advanced Practice Midwife

## 2016-08-02 DIAGNOSIS — M791 Myalgia: Secondary | ICD-10-CM

## 2016-08-02 DIAGNOSIS — Z3202 Encounter for pregnancy test, result negative: Secondary | ICD-10-CM

## 2016-08-02 DIAGNOSIS — Z30017 Encounter for initial prescription of implantable subdermal contraceptive: Secondary | ICD-10-CM | POA: Diagnosis not present

## 2016-08-02 DIAGNOSIS — M7918 Myalgia, other site: Secondary | ICD-10-CM

## 2016-08-02 LAB — POCT PREGNANCY, URINE: PREG TEST UR: NEGATIVE

## 2016-08-02 MED ORDER — CYCLOBENZAPRINE HCL 10 MG PO TABS: 10.0000 mg | ORAL_TABLET | Freq: Three times a day (TID) | ORAL | 1 refills | Status: DC | PRN

## 2016-08-02 MED ORDER — ETONOGESTREL 68 MG ~~LOC~~ IMPL
68.0000 mg | DRUG_IMPLANT | Freq: Once | SUBCUTANEOUS | Status: AC
  Administered 2016-08-02: 68 mg via SUBCUTANEOUS

## 2016-08-02 NOTE — Progress Notes (Signed)
Subjective:     Veronica Blackwell is a 35 y.o. female who presents for a postpartum visit. She is 10 weeks postpartum following a low cervical transverse Cesarean section. I have fully reviewed the prenatal and intrapartum course. The delivery was at 4588w5d gestational weeks. Outcome: repeat cesarean section, low transverse incision. Anesthesia: spinal. Postpartum course has been uncomplicated. Baby's course has been uncomplicated. Baby is feeding by bottle - Similac Advance. Bleeding no bleeding. Bowel function is normal. Bladder function is normal. Patient is sexually active. Contraception method is condoms. Claims last IC ~2 weeks ago w/ condom. Requesting Nexplanon. (Was inconsistent w/ this response however). Postpartum depression screening: negative.  The following portions of the patient's history were reviewed and updated as appropriate: allergies, current medications, past family history, past medical history, past social history, past surgical history and problem list.  Review of Systems Pertinent items are noted in HPI.   Right mid back pain radiating to right side of abd x a few weeks. No Hematuria, dysuria, frequency, urgency, fever, chills, N/V/D/C.   Objective:    BP 127/87   Pulse 87   Ht 5\' 1"  (1.549 m)   Wt 167 lb 14.4 oz (76.2 kg)   Breastfeeding? No   BMI 31.72 kg/m   General:  alert, cooperative, appears stated age and no distress   Breasts:  declined  Lungs: clear to auscultation bilaterally  Heart:  regular rate and rhythm, S1, S2 normal, no murmur, click, rub or gallop  Abdomen: soft, non-tender; bowel sounds normal; no masses,  no organomegaly. Incision healing well. NT. Neg rebound or guarding.    Vulva:  not evaluated  Back: No CVAT. Mild TTP. Normal ROM         Patient given informed consent, signed copy in the chart, time out was performed. Pregnancy test was neg. Appropriate time out taken.  Patient's left arm was prepped and draped in the usual sterile  fashion.. The ruler used to measure and mark insertion area.  Pt was prepped with alcohol swab and then injected with 2 cc of 1% lidocaine with epinephrine.  Pt was prepped with betadine, Implanon removed form packaging,  Device confirmed in needle, then inserted full length of needle and withdrawn per handbook instructions.  Pt insertion site covered with pressure dressing.   Minimal blood loss.  Pt tolerated the procedure well.   Assessment:     Normal postpartum exam. MS back pain . Pap smear not done at today's visit. Due 2020.  Plan:    1. Contraception: Nexplanon 2. Rx Flexeril, Ibu 3. Follow up in: 1 year or as needed.

## 2016-08-02 NOTE — Progress Notes (Signed)
States she did use condoms with intercourse, which only occurred once.

## 2016-08-02 NOTE — Progress Notes (Signed)
Would like nexplanon. Has had unprotected intercourse about a week ago.

## 2016-08-02 NOTE — Patient Instructions (Signed)
Etonogestrel implant What is this medicine? ETONOGESTREL (et oh noe JES trel) is a contraceptive (birth control) device. It is used to prevent pregnancy. It can be used for up to 3 years. This medicine may be used for other purposes; ask your health care provider or pharmacist if you have questions. What should I tell my health care provider before I take this medicine? They need to know if you have any of these conditions: -abnormal vaginal bleeding -blood vessel disease or blood clots -cancer of the breast, cervix, or liver -depression -diabetes -gallbladder disease -headaches -heart disease or recent heart attack -high blood pressure -high cholesterol -kidney disease -liver disease -renal disease -seizures -tobacco smoker -an unusual or allergic reaction to etonogestrel, other hormones, anesthetics or antiseptics, medicines, foods, dyes, or preservatives -pregnant or trying to get pregnant -breast-feeding How should I use this medicine? This device is inserted just under the skin on the inner side of your upper arm by a health care professional. Talk to your pediatrician regarding the use of this medicine in children. Special care may be needed. Overdosage: If you think you have taken too much of this medicine contact a poison control center or emergency room at once. NOTE: This medicine is only for you. Do not share this medicine with others. What if I miss a dose? This does not apply. What may interact with this medicine? Do not take this medicine with any of the following medications: -amprenavir -bosentan -fosamprenavir This medicine may also interact with the following medications: -barbiturate medicines for inducing sleep or treating seizures -certain medicines for fungal infections like ketoconazole and itraconazole -griseofulvin -medicines to treat seizures like carbamazepine, felbamate, oxcarbazepine, phenytoin,  topiramate -modafinil -phenylbutazone -rifampin -some medicines to treat HIV infection like atazanavir, indinavir, lopinavir, nelfinavir, tipranavir, ritonavir -St. John's wort This list may not describe all possible interactions. Give your health care provider a list of all the medicines, herbs, non-prescription drugs, or dietary supplements you use. Also tell them if you smoke, drink alcohol, or use illegal drugs. Some items may interact with your medicine. What should I watch for while using this medicine? This product does not protect you against HIV infection (AIDS) or other sexually transmitted diseases. You should be able to feel the implant by pressing your fingertips over the skin where it was inserted. Contact your doctor if you cannot feel the implant, and use a non-hormonal birth control method (such as condoms) until your doctor confirms that the implant is in place. If you feel that the implant may have broken or become bent while in your arm, contact your healthcare provider. What side effects may I notice from receiving this medicine? Side effects that you should report to your doctor or health care professional as soon as possible: -allergic reactions like skin rash, itching or hives, swelling of the face, lips, or tongue -breast lumps -changes in emotions or moods -depressed mood -heavy or prolonged menstrual bleeding -pain, irritation, swelling, or bruising at the insertion site -scar at site of insertion -signs of infection at the insertion site such as fever, and skin redness, pain or discharge -signs of pregnancy -signs and symptoms of a blood clot such as breathing problems; changes in vision; chest pain; severe, sudden headache; pain, swelling, warmth in the leg; trouble speaking; sudden numbness or weakness of the face, arm or leg -signs and symptoms of liver injury like dark yellow or brown urine; general ill feeling or flu-like symptoms; light-colored stools; loss of  appetite; nausea; right upper belly   pain; unusually weak or tired; yellowing of the eyes or skin -unusual vaginal bleeding, discharge -signs and symptoms of a stroke like changes in vision; confusion; trouble speaking or understanding; severe headaches; sudden numbness or weakness of the face, arm or leg; trouble walking; dizziness; loss of balance or coordination Side effects that usually do not require medical attention (Report these to your doctor or health care professional if they continue or are bothersome.): -acne -back pain -breast pain -changes in weight -dizziness -general ill feeling or flu-like symptoms -headache -irregular menstrual bleeding -nausea -sore throat -vaginal irritation or inflammation This list may not describe all possible side effects. Call your doctor for medical advice about side effects. You may report side effects to FDA at 1-800-FDA-1088. Where should I keep my medicine? This drug is given in a hospital or clinic and will not be stored at home. NOTE: This sheet is a summary. It may not cover all possible information. If you have questions about this medicine, talk to your doctor, pharmacist, or health care provider.    2016, Elsevier/Gold Standard. (2014-08-08 14:07:06)  

## 2017-02-22 ENCOUNTER — Ambulatory Visit (HOSPITAL_COMMUNITY)
Admission: EM | Admit: 2017-02-22 | Discharge: 2017-02-22 | Disposition: A | Discharge status: Other Acute Inpatient Hospital | Payer: Medicaid Other

## 2017-02-22 ENCOUNTER — Other Ambulatory Visit: Payer: Self-pay | Admitting: *Deleted

## 2018-02-02 DIAGNOSIS — H10022 Other mucopurulent conjunctivitis, left eye: Secondary | ICD-10-CM | POA: Diagnosis not present

## 2018-02-08 ENCOUNTER — Ambulatory Visit: Payer: Medicaid Other | Admitting: Family Medicine

## 2018-09-03 ENCOUNTER — Encounter: Payer: Self-pay | Admitting: *Deleted

## 2018-11-08 DIAGNOSIS — B009 Herpesviral infection, unspecified: Secondary | ICD-10-CM | POA: Diagnosis not present

## 2018-11-08 DIAGNOSIS — M545 Low back pain: Secondary | ICD-10-CM | POA: Diagnosis not present

## 2018-11-20 DIAGNOSIS — G8929 Other chronic pain: Secondary | ICD-10-CM | POA: Diagnosis not present

## 2018-11-20 DIAGNOSIS — Z79899 Other long term (current) drug therapy: Secondary | ICD-10-CM | POA: Diagnosis not present

## 2018-11-20 DIAGNOSIS — M129 Arthropathy, unspecified: Secondary | ICD-10-CM | POA: Diagnosis not present

## 2018-11-20 DIAGNOSIS — E559 Vitamin D deficiency, unspecified: Secondary | ICD-10-CM | POA: Diagnosis not present

## 2018-11-20 DIAGNOSIS — Z3200 Encounter for pregnancy test, result unknown: Secondary | ICD-10-CM | POA: Diagnosis not present

## 2018-11-20 DIAGNOSIS — M545 Low back pain: Secondary | ICD-10-CM | POA: Diagnosis not present

## 2018-12-18 DIAGNOSIS — R899 Unspecified abnormal finding in specimens from other organs, systems and tissues: Secondary | ICD-10-CM | POA: Diagnosis not present

## 2018-12-18 DIAGNOSIS — M545 Low back pain: Secondary | ICD-10-CM | POA: Diagnosis not present

## 2018-12-18 DIAGNOSIS — E559 Vitamin D deficiency, unspecified: Secondary | ICD-10-CM | POA: Diagnosis not present

## 2018-12-18 DIAGNOSIS — G8929 Other chronic pain: Secondary | ICD-10-CM | POA: Diagnosis not present

## 2019-05-29 ENCOUNTER — Other Ambulatory Visit: Payer: Self-pay

## 2019-05-29 ENCOUNTER — Emergency Department (HOSPITAL_COMMUNITY)
Admission: EM | Admit: 2019-05-29 | Discharge: 2019-05-29 | Disposition: A | Discharge status: Home / Self Care | Payer: Medicaid Other | Attending: Emergency Medicine | Admitting: Emergency Medicine

## 2019-05-29 ENCOUNTER — Encounter (HOSPITAL_COMMUNITY): Payer: Self-pay | Admitting: Obstetrics and Gynecology

## 2019-05-29 DIAGNOSIS — S50851A Superficial foreign body of right forearm, initial encounter: Secondary | ICD-10-CM | POA: Diagnosis not present

## 2019-05-29 DIAGNOSIS — S50851D Superficial foreign body of right forearm, subsequent encounter: Secondary | ICD-10-CM | POA: Diagnosis not present

## 2019-05-29 DIAGNOSIS — Z760 Encounter for issue of repeat prescription: Secondary | ICD-10-CM | POA: Insufficient documentation

## 2019-05-29 DIAGNOSIS — S50852D Superficial foreign body of left forearm, subsequent encounter: Secondary | ICD-10-CM | POA: Insufficient documentation

## 2019-05-29 DIAGNOSIS — Z91040 Latex allergy status: Secondary | ICD-10-CM | POA: Insufficient documentation

## 2019-05-29 DIAGNOSIS — F17210 Nicotine dependence, cigarettes, uncomplicated: Secondary | ICD-10-CM | POA: Insufficient documentation

## 2019-05-29 DIAGNOSIS — S50859A Superficial foreign body of unspecified forearm, initial encounter: Secondary | ICD-10-CM

## 2019-05-29 DIAGNOSIS — Z79899 Other long term (current) drug therapy: Secondary | ICD-10-CM | POA: Diagnosis not present

## 2019-05-29 DIAGNOSIS — S50852A Superficial foreign body of left forearm, initial encounter: Secondary | ICD-10-CM | POA: Diagnosis not present

## 2019-05-29 MED ORDER — DICLOFENAC SODIUM 50 MG PO TBEC: 50.0000 mg | DELAYED_RELEASE_TABLET | Freq: Two times a day (BID) | ORAL | 0 refills | Status: DC

## 2019-05-29 MED ORDER — VALACYCLOVIR HCL 500 MG PO TABS: 500.0000 mg | ORAL_TABLET | Freq: Every day | ORAL | 0 refills | Status: DC

## 2019-05-29 MED ORDER — PREDNISONE 10 MG PO TABS: ORAL_TABLET | ORAL | 0 refills | Status: DC
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
## 2019-05-29 NOTE — Discharge Instructions (Addendum)
Referral to hand surgeon for palate issue.  Prescription for prednisone and anti-inflammatory medicine for your arm pain.  Also Rx for Valtrex.

## 2019-08-12 ENCOUNTER — Other Ambulatory Visit: Payer: Self-pay

## 2019-08-12 ENCOUNTER — Ambulatory Visit (INDEPENDENT_AMBULATORY_CARE_PROVIDER_SITE_OTHER): Payer: Medicaid Other | Admitting: Certified Nurse Midwife

## 2019-08-12 ENCOUNTER — Encounter: Payer: Self-pay | Admitting: Certified Nurse Midwife

## 2019-08-12 ENCOUNTER — Other Ambulatory Visit (HOSPITAL_COMMUNITY)
Admission: RE | Admit: 2019-08-12 | Discharge: 2019-08-12 | Disposition: A | Discharge status: Home / Self Care | Payer: Medicaid Other | Source: Ambulatory Visit | Attending: Certified Nurse Midwife | Admitting: Certified Nurse Midwife

## 2019-08-12 VITALS — BP 117/72 | HR 88 | Temp 98.6°F | Ht 62.0 in | Wt 194.6 lb

## 2019-08-12 DIAGNOSIS — Z01419 Encounter for gynecological examination (general) (routine) without abnormal findings: Secondary | ICD-10-CM

## 2019-08-12 DIAGNOSIS — Z3046 Encounter for surveillance of implantable subdermal contraceptive: Secondary | ICD-10-CM | POA: Diagnosis not present

## 2019-08-12 DIAGNOSIS — Z0000 Encounter for general adult medical examination without abnormal findings: Secondary | ICD-10-CM

## 2019-08-12 MED ORDER — ETONOGESTREL 68 MG ~~LOC~~ IMPL
68.0000 mg | DRUG_IMPLANT | Freq: Once | SUBCUTANEOUS | Status: AC
  Administered 2019-08-12: 68 mg via SUBCUTANEOUS

## 2019-08-12 NOTE — Progress Notes (Signed)
GYNECOLOGY ANNUAL PREVENTATIVE CARE ENCOUNTER NOTE  History:     Veronica Blackwell is a 38 y.o. G64P3003 female here for a routine annual gynecologic exam.  Current complaints: none, nexplanon removal and reinsertion.   Denies abnormal vaginal bleeding, discharge, pelvic pain, problems with intercourse or other gynecologic concerns.    Gynecologic History Patient's last menstrual period was 07/28/2019 (exact date). Contraception: Nexplanon Patient reports last pap 3 years ago with last pregnancy- normal, no records in chart   Obstetric History OB History  Gravida Para Term Preterm AB Living  3 3 3  0 0 3  SAB TAB Ectopic Multiple Live Births  0 0 0 0 3    # Outcome Date GA Lbr Len/2nd Weight Sex Delivery Anes PTL Lv  3 Term 05/23/16 [redacted]w[redacted]d  5 lb 9.4 oz (2.535 kg) M CS-LTranv Spinal  LIV     Birth Comments: No gross abnormalities seen.  2 Term 06/18/04 [redacted]w[redacted]d  5 lb 14 oz (2.665 kg) F CS-Unspec EPI N LIV  1 Term 06/22/02 [redacted]w[redacted]d  5 lb 7 oz (2.466 kg) M CS-Unspec EPI N LIV    Past Medical History:  Diagnosis Date  . Cocaine abuse (HCC)   . Genital herpes   . Migraine     Past Surgical History:  Procedure Laterality Date  . ARTERY REPAIR     L wrist  . CESAREAN SECTION    . CESAREAN SECTION N/A 05/23/2016   Procedure: CESAREAN SECTION;  Surgeon: 05/25/2016, MD;  Location: [facility] BIRTHING SUITES;  Service: Obstetrics;  Laterality: N/A;  . FOOT SURGERY     Right    Current Outpatient Medications on File Prior to Visit  Medication Sig Dispense Refill  . cyclobenzaprine (FLEXERIL) 10 MG tablet Take 1 tablet (10 mg total) by mouth every 8 (eight) hours as needed for muscle spasms. 30 tablet 1  . valACYclovir (VALTREX) 500 MG tablet Take 1 tablet (500 mg total) by mouth daily. 90 tablet 0  . oxyCODONE (ROXICODONE) 5 MG immediate release tablet Take 1 tablet (5 mg total) by mouth every 4 (four) hours as needed for severe pain. (Patient not taking: Reported on 08/12/2019) 30 tablet  0  . predniSONE (DELTASONE) 10 MG tablet 3 tablets for 3 days, 2 tablets for 3 days, 1 tablet for 3 days. (Patient not taking: Reported on 08/12/2019) 18 tablet 0  . Prenatal Vit-Fe Fumarate-FA (PRENATAL MULTIVITAMIN) TABS tablet Take 1 tablet by mouth daily at 12 noon. (Patient not taking: Reported on 08/12/2019) 90 tablet 1   No current facility-administered medications on file prior to visit.     Allergies  Allergen Reactions  . Latex Anaphylaxis    Social History:  reports that she has been smoking cigarettes. She has been smoking about 0.50 packs per day. She has never used smokeless tobacco. She reports current alcohol use. She reports that she does not use drugs.  Family History  Problem Relation Age of Onset  . Diabetes Mother   . Heart disease Mother   . Kidney disease Mother   . Early death Mother 58       heart failure  . Hypertension Mother     The following portions of the patient's history were reviewed and updated as appropriate: allergies, current medications, past family history, past medical history, past social history, past surgical history and problem list.  Review of Systems Pertinent items noted in HPI and remainder of comprehensive ROS otherwise negative.  Physical Exam:  BP 117/72  Pulse 88   Temp 98.6 F (37 C)   Ht 5\' 2"  (1.575 m)   Wt 194 lb 9.6 oz (88.3 kg)   LMP 07/28/2019 (Exact Date)   BMI 35.59 kg/m  CONSTITUTIONAL: Well-developed, well-nourished female in no acute distress.  HENT:  Normocephalic, atraumatic, External right and left ear normal. Oropharynx is clear and moist EYES: Conjunctivae and EOM are normal. Pupils are equal, round, and reactive to light.  NECK: Normal range of motion, supple, no masses.  Normal thyroid.  SKIN: Skin is warm and dry. No rash noted. Not diaphoretic. No erythema. No pallor. MUSCULOSKELETAL: Normal range of motion. No tenderness.  No cyanosis, clubbing, or edema.  2+ distal pulses. NEUROLOGIC: Alert and  oriented to person, place, and time. Normal reflexes, muscle tone coordination. No cranial nerve deficit noted. PSYCHIATRIC: Normal mood and affect. Normal behavior. Normal judgment and thought content. CARDIOVASCULAR: Normal heart rate noted, regular rhythm RESPIRATORY: Clear to auscultation bilaterally. Effort and breath sounds normal, no problems with respiration noted. BREASTS: Symmetric in size. No masses, skin changes, nipple drainage, or lymphadenopathy. ABDOMEN: Soft, normal bowel sounds, no distention noted.  No tenderness, rebound or guarding.  PELVIC: Normal appearing external genitalia; normal appearing vaginal mucosa and cervix.  No abnormal discharge noted.  Pap smear obtained.  Normal uterine size, no other palpable masses, no uterine or adnexal tenderness.     PROCEDURE:  GYNECOLOGY CLINIC PROCEDURE NOTE Nexplanon Removal and Insertion  Patient was given informed consent for removal of her Nexplanon and reinsertion of Nexplanon.  Patient does understand that irregular bleeding is a very common side effect of this medication. She was advised to have backup contraception for one week after replacement of the implant. Appropriate time out taken. Implanon site identified. Area prepped in usual sterile fashon. One ml of 1% lidocaine was used to anesthetize the area at the distal end of the implant. A small stab incision was made right beside the implant on the distal portion. The Nexplanon rod was grasped using hemostats and removed without difficulty. There was minimal blood loss. There were no complications. Area was then injected with 3 ml of 1 % lidocaine. She was re-prepped with betadine, Nexplanon removed from packaging, Device confirmed in needle, then inserted full length of needle and withdrawn per handbook instructions. Nexplanon was able to palpated in the patient's arm; patient palpated the insert herself.  There was minimal blood loss. Patient insertion site covered with guaze and  a pressure bandage to reduce any bruising. The patient tolerated the procedure well and was given post procedure instructions.   Assessment and Plan:    1. Women's annual routine gynecological examination - Normal well woman examination  - Cytology - PAP( [facility])  2. Encounter for removal and reinsertion of Nexplanon - see above procedure note  - request removal and reinsertion of Nexplanon - etonogestrel (NEXPLANON) implant 68 mg  Will follow up results of pap smear and manage accordingly. Routine preventative health maintenance measures emphasized. Please refer to After Visit Summary for other counseling recommendations.      Lajean Manes, [place] for [employer], [place]

## 2019-08-12 NOTE — Patient Instructions (Signed)
Nexplanon Instructions After Insertion  Keep bandage clean and dry for 24 hours  May use ice/Tylenol/Ibuprofen for soreness or pain  If you develop fever, drainage or increased warmth from incision site-contact office immediately   

## 2019-08-20 LAB — CYTOLOGY - PAP
Adequacy: ABSENT
Chlamydia: NEGATIVE
Diagnosis: NEGATIVE
HPV 16: NEGATIVE
HPV 18 / 45: NEGATIVE
High risk HPV: POSITIVE — AB
Neisseria Gonorrhea: NEGATIVE

## 2019-08-22 ENCOUNTER — Telehealth: Payer: Self-pay

## 2019-08-22 NOTE — Telephone Encounter (Signed)
-----   Message from Lajean Manes, CNM sent at 08/22/2019  1:30 AM EDT ----- Please call patient and notify of abnormal pap smear results. Patient needs to have repeat pap smear in 1 year- if abnormal next year then would need a colposcopy   Darrol Poke CNM

## 2019-08-22 NOTE — Telephone Encounter (Signed)
Patient called and made aware of abnormal pap smear. Patient made aware that she had HPV on her pap smear and it is important for her to schedule and annual exam with pap smear in Oct 2021. Kathrene Alu RN

## 2020-08-21 ENCOUNTER — Ambulatory Visit: Payer: Medicaid Other | Admitting: Family Medicine

## 2020-08-24 ENCOUNTER — Ambulatory Visit: Payer: Medicaid Other | Admitting: Physician Assistant

## 2020-08-24 ENCOUNTER — Other Ambulatory Visit: Payer: Self-pay

## 2020-08-24 ENCOUNTER — Encounter: Payer: Self-pay | Admitting: Physician Assistant

## 2020-08-24 DIAGNOSIS — Z113 Encounter for screening for infections with a predominantly sexual mode of transmission: Secondary | ICD-10-CM

## 2020-08-24 DIAGNOSIS — Z299 Encounter for prophylactic measures, unspecified: Secondary | ICD-10-CM

## 2020-08-24 NOTE — Progress Notes (Signed)
Pt is here for STD screening. 

## 2020-08-26 ENCOUNTER — Encounter: Payer: Self-pay | Admitting: Physician Assistant

## 2020-08-26 MED ORDER — VALACYCLOVIR HCL 500 MG PO TABS: 500.0000 mg | ORAL_TABLET | Freq: Two times a day (BID) | ORAL | 3 refills | Status: DC
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
## 2020-09-21 DIAGNOSIS — R030 Elevated blood-pressure reading, without diagnosis of hypertension: Secondary | ICD-10-CM | POA: Diagnosis not present

## 2020-09-21 DIAGNOSIS — S00531A Contusion of lip, initial encounter: Secondary | ICD-10-CM | POA: Diagnosis not present

## 2021-06-19 DIAGNOSIS — R109 Unspecified abdominal pain: Secondary | ICD-10-CM | POA: Diagnosis not present

## 2021-06-19 DIAGNOSIS — R200 Anesthesia of skin: Secondary | ICD-10-CM | POA: Diagnosis not present

## 2021-06-19 DIAGNOSIS — R202 Paresthesia of skin: Secondary | ICD-10-CM | POA: Diagnosis not present

## 2021-09-09 ENCOUNTER — Encounter: Payer: Self-pay | Admitting: *Deleted

## 2022-01-25 ENCOUNTER — Ambulatory Visit: Payer: Medicaid Other

## 2022-02-25 ENCOUNTER — Encounter: Payer: Self-pay | Admitting: Advanced Practice Midwife

## 2022-02-25 ENCOUNTER — Ambulatory Visit (LOCAL_COMMUNITY_HEALTH_CENTER): Payer: Medicaid Other | Admitting: Advanced Practice Midwife

## 2022-02-25 DIAGNOSIS — Z3009 Encounter for other general counseling and advice on contraception: Secondary | ICD-10-CM

## 2022-02-25 DIAGNOSIS — Z309 Encounter for contraceptive management, unspecified: Secondary | ICD-10-CM | POA: Diagnosis not present

## 2022-02-25 DIAGNOSIS — Z3046 Encounter for surveillance of implantable subdermal contraceptive: Secondary | ICD-10-CM | POA: Diagnosis not present

## 2022-02-25 DIAGNOSIS — Z299 Encounter for prophylactic measures, unspecified: Secondary | ICD-10-CM | POA: Diagnosis not present

## 2022-02-25 DIAGNOSIS — E663 Overweight: Secondary | ICD-10-CM | POA: Insufficient documentation

## 2022-02-25 DIAGNOSIS — Z0389 Encounter for observation for other suspected diseases and conditions ruled out: Secondary | ICD-10-CM | POA: Diagnosis not present

## 2022-02-25 LAB — WET PREP FOR TRICH, YEAST, CLUE
Trichomonas Exam: NEGATIVE
Yeast Exam: NEGATIVE

## 2022-02-25 LAB — HM HIV SCREENING LAB: HM HIV Screening: NEGATIVE

## 2022-02-25 MED ORDER — VALACYCLOVIR HCL 500 MG PO TABS: 500.0000 mg | ORAL_TABLET | Freq: Two times a day (BID) | ORAL | 3 refills | Status: DC
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
## 2022-02-25 NOTE — Progress Notes (Addendum)
Patient states here for annual exam. Admits to lower back pain today: works at [employer] Tuesdays and on feet while at work. Needs Valtrez refill today.  ?Provider reviewed in House labs prior to discharge. Delynn Flavin RN ?

## 2022-03-07 LAB — IGP, APTIMA HPV
HPV Aptima: NEGATIVE
PAP Smear Comment: 0

## 2022-03-28 DIAGNOSIS — H5213 Myopia, bilateral: Secondary | ICD-10-CM | POA: Diagnosis not present

## 2022-04-26 ENCOUNTER — Telehealth: Payer: Self-pay

## 2022-04-26 NOTE — Telephone Encounter (Signed)
Left message to call back if interested in connecting with a PCP for healthcare. Call back number [phone].

## 2022-05-19 ENCOUNTER — Telehealth: Payer: Self-pay

## 2022-05-19 NOTE — Telephone Encounter (Signed)
Made pt. New Pt. Appointment at [facility] as requested.

## 2022-08-11 ENCOUNTER — Emergency Department: Payer: Medicaid Other

## 2022-08-11 ENCOUNTER — Other Ambulatory Visit: Payer: Self-pay

## 2022-08-11 ENCOUNTER — Emergency Department
Admission: EM | Admit: 2022-08-11 | Discharge: 2022-08-11 | Disposition: A | Discharge status: Home / Self Care | Payer: Medicaid Other | Attending: Student in an Organized Health Care Education/Training Program | Admitting: Student in an Organized Health Care Education/Training Program

## 2022-08-11 DIAGNOSIS — R1013 Epigastric pain: Secondary | ICD-10-CM | POA: Insufficient documentation

## 2022-08-11 DIAGNOSIS — R112 Nausea with vomiting, unspecified: Secondary | ICD-10-CM | POA: Diagnosis not present

## 2022-08-11 DIAGNOSIS — R1111 Vomiting without nausea: Secondary | ICD-10-CM | POA: Diagnosis not present

## 2022-08-11 DIAGNOSIS — N888 Other specified noninflammatory disorders of cervix uteri: Secondary | ICD-10-CM | POA: Diagnosis not present

## 2022-08-11 DIAGNOSIS — K6389 Other specified diseases of intestine: Secondary | ICD-10-CM | POA: Diagnosis not present

## 2022-08-11 DIAGNOSIS — F17210 Nicotine dependence, cigarettes, uncomplicated: Secondary | ICD-10-CM | POA: Diagnosis not present

## 2022-08-11 DIAGNOSIS — R110 Nausea: Secondary | ICD-10-CM | POA: Diagnosis not present

## 2022-08-11 DIAGNOSIS — K449 Diaphragmatic hernia without obstruction or gangrene: Secondary | ICD-10-CM | POA: Diagnosis not present

## 2022-08-11 LAB — POC URINE PREG, ED: Preg Test, Ur: NEGATIVE

## 2022-08-11 LAB — COMPREHENSIVE METABOLIC PANEL
ALT: 9 U/L (ref 0–44)
AST: 22 U/L (ref 15–41)
Albumin: 3.8 g/dL (ref 3.5–5.0)
Alkaline Phosphatase: 61 U/L (ref 38–126)
Anion gap: 12 (ref 5–15)
BUN: 5 mg/dL — ABNORMAL LOW (ref 6–20)
CO2: 29 mmol/L (ref 22–32)
Calcium: 9.3 mg/dL (ref 8.9–10.3)
Chloride: 96 mmol/L — ABNORMAL LOW (ref 98–111)
Creatinine, Ser: 0.77 mg/dL (ref 0.44–1.00)
GFR, Estimated: >60 mL/min (ref >60)
Glucose, Bld: 119 mg/dL — ABNORMAL HIGH (ref 70–99)
Potassium: 2.7 mmol/L — CL (ref 3.5–5.1)
Sodium: 137 mmol/L (ref 135–145)
Total Bilirubin: 0.7 mg/dL (ref 0.3–1.2)
Total Protein: 8.1 g/dL (ref 6.5–8.1)

## 2022-08-11 LAB — CBC
HCT: 41.3 % (ref 36.0–46.0)
Hemoglobin: 13.6 g/dL (ref 12.0–15.0)
MCH: 29.8 pg (ref 26.0–34.0)
MCHC: 32.9 g/dL (ref 30.0–36.0)
MCV: 90.4 fL (ref 80.0–100.0)
Platelets: 239 10*3/uL (ref 150–400)
RBC: 4.57 MIL/uL (ref 3.87–5.11)
RDW: 14.2 % (ref 11.5–15.5)
WBC: 9.2 10*3/uL (ref 4.0–10.5)
nRBC: 0 % (ref 0.0–0.2)

## 2022-08-11 LAB — HEMOGLOBIN: Hemoglobin: 12.2 g/dL (ref 12.0–15.0)

## 2022-08-11 MED ORDER — MORPHINE SULFATE (PF) 4 MG/ML IV SOLN
4.0000 mg | INTRAVENOUS | Status: DC | PRN
  Filled 2022-08-11: qty 1

## 2022-08-11 MED ORDER — POTASSIUM CHLORIDE 10 MEQ/100ML IV SOLN
10.0000 meq | INTRAVENOUS | Status: AC
  Administered 2022-08-11 (×3): 10 meq via INTRAVENOUS
  Filled 2022-08-11 (×3): qty 100

## 2022-08-11 MED ORDER — LACTATED RINGERS IV BOLUS
1000.0000 mL | Freq: Once | INTRAVENOUS | Status: AC
  Administered 2022-08-11: 1000 mL via INTRAVENOUS

## 2022-08-11 MED ORDER — PANTOPRAZOLE SODIUM 40 MG IV SOLR
40.0000 mg | Freq: Once | INTRAVENOUS | Status: AC
  Administered 2022-08-11: 40 mg via INTRAVENOUS
  Filled 2022-08-11: qty 10

## 2022-08-11 MED ORDER — METOCLOPRAMIDE HCL 5 MG/ML IJ SOLN
10.0000 mg | Freq: Once | INTRAMUSCULAR | Status: AC
  Administered 2022-08-11: 10 mg via INTRAVENOUS
  Filled 2022-08-11: qty 2

## 2022-08-11 MED ORDER — ONDANSETRON 4 MG PO TBDP: 4.0000 mg | ORAL_TABLET | Freq: Three times a day (TID) | ORAL | 0 refills | Status: DC | PRN

## 2022-08-11 MED ORDER — ONDANSETRON 4 MG PO TBDP: 4.0000 mg | ORAL_TABLET | Freq: Once | ORAL | Status: AC

## 2022-08-11 MED ORDER — POTASSIUM CHLORIDE CRYS ER 20 MEQ PO TBCR
40.0000 meq | EXTENDED_RELEASE_TABLET | Freq: Once | ORAL | Status: AC
  Administered 2022-08-11: 40 meq via ORAL
  Filled 2022-08-11: qty 2

## 2022-08-11 MED ORDER — ONDANSETRON 4 MG PO TBDP
ORAL_TABLET | ORAL | Status: AC
  Administered 2022-08-11: 4 mg via ORAL
  Filled 2022-08-11: qty 1

## 2022-08-11 MED ORDER — PANTOPRAZOLE SODIUM 40 MG PO TBEC: 40.0000 mg | DELAYED_RELEASE_TABLET | Freq: Every day | ORAL | 1 refills | Status: DC

## 2022-08-11 MED ORDER — IOHEXOL 300 MG/ML  SOLN
100.0000 mL | Freq: Once | INTRAMUSCULAR | Status: AC | PRN
  Administered 2022-08-11: 100 mL via INTRAVENOUS

## 2022-08-11 NOTE — Discharge Instructions (Addendum)
Please follow up with Gyn and GI clinic.  Take medications as prescribed.  Return for worsening pain, nausea, weakness, shortness of breath or any additional questions or concerns.

## 2022-08-11 NOTE — ED Provider Notes (Signed)
[facility] Provider Note    Event Date/Time   First MD Initiated Contact with Patient 08/11/22 1720     (approximate)   History   Abdominal Pain and Nausea (C/o abdominal pain with n/v x5 days, with black tarry stools x3 days. Denies syncope, states feels light-headed when she vomits.)   HPI  Veronica Blackwell is a 41 y.o. female presents to the ER for evaluation of 3 days of epigastric discomfort nausea vomiting no coffee-ground emesis no hematemesis but has noted some darker stools.  States that she does have history of ulcers and she is taking lots of BC Goody powders but this was in the past.  Has not been taking any significant NSAIDs or BC Goody powders recently she is not on any blood thinners.  Is complaining of moderate to severe epigastric pain.  She does smoke cigarettes as well as marijuana.  No history of liver disease or cirrhosis.     Physical Exam   Triage Vital Signs: ED Triage Vitals  Enc Vitals Group     BP 08/11/22 1433 (!) 140/88     Pulse Rate 08/11/22 1433 99     Resp 08/11/22 1433 20     Temp 08/11/22 1433 (!) 97.5 F (36.4 C)     Temp Source 08/11/22 1433 Axillary     SpO2 08/11/22 1433 98 %     Weight 08/11/22 1435 150 lb (68 kg)     Height 08/11/22 1435 5\' 1"  (1.549 m)     Head Circumference --      Peak Flow --      Pain Score 08/11/22 1434 10     Pain Loc --      Pain Edu? --      Excl. in GC? --     Most recent vital signs: Vitals:   08/11/22 2000 08/11/22 2215  BP: 110/77 119/71  Pulse: (!) 56 69  Resp:  18  Temp:    SpO2: 98% 98%     Constitutional: Alert  Eyes: Conjunctivae are normal.  Head: Atraumatic. Nose: No congestion/rhinnorhea. Mouth/Throat: Mucous membranes are moist.   Neck: Painless ROM.  Cardiovascular:   Good peripheral circulation. Respiratory: Normal respiratory effort.  No retractions.  Gastrointestinal: Soft with mild ttp in epigastric region Musculoskeletal:  no  deformity Neurologic:  MAE spontaneously. No gross focal neurologic deficits are appreciated.  Skin:  Skin is warm, dry and intact. No rash noted. Psychiatric: Mood and affect are normal. Speech and behavior are normal.    ED Results / Procedures / Treatments   Labs (all labs ordered are listed, but only abnormal results are displayed) Labs Reviewed  COMPREHENSIVE METABOLIC PANEL - Abnormal; Notable for the following components:      Result Value   Potassium 2.7 (*)    Chloride 96 (*)    Glucose, Bld 119 (*)    BUN 5 (*)    All other components within normal limits  CBC  HEMOGLOBIN  POC URINE PREG, ED  POC OCCULT BLOOD, ED     EKG     RADIOLOGY Please see ED Course for my review and interpretation.  I personally reviewed all radiographic images ordered to evaluate for the above acute complaints and reviewed radiology reports and findings.  These findings were personally discussed with the patient.  Please see medical record for radiology report.    PROCEDURES:  Critical Care performed:   Procedures   MEDICATIONS ORDERED IN ED: Medications  morphine (PF) 4 MG/ML injection 4 mg (has no administration in time range)  ondansetron (ZOFRAN-ODT) disintegrating tablet 4 mg (4 mg Oral Given 08/11/22 1704)  lactated ringers bolus 1,000 mL (0 mLs Intravenous Stopped 08/11/22 2201)  potassium chloride 10 mEq in 100 mL IVPB (0 mEq Intravenous Stopped 08/11/22 2223)  pantoprazole (PROTONIX) injection 40 mg (40 mg Intravenous Given 08/11/22 1839)  iohexol (OMNIPAQUE) 300 MG/ML solution 100 mL (100 mLs Intravenous Contrast Given 08/11/22 1817)  metoCLOPramide (REGLAN) injection 10 mg (10 mg Intravenous Given 08/11/22 1850)  potassium chloride SA (KLOR-CON M) CR tablet 40 mEq (40 mEq Oral Given 08/11/22 1927)     IMPRESSION / MDM / ASSESSMENT AND PLAN / ED COURSE  I reviewed the triage vital signs and the nursing notes.                              Differential diagnosis  includes, but is not limited to, gastritis, pud, enteritis, appy, diverticulitis, cyclic vomiting, sbo  Patient presenting to the ER for evaluation of symptoms as described above.  Base on symptoms, risk factors and considered above differential, this presenting complaint could reflect a potentially life-threatening illness therefore the patient will be placed on continuous pulse oximetry and telemetry for monitoring.  Laboratory evaluation will be sent to evaluate for the above complaints.       Clinical Course as of 08/11/22 2330  Thu Aug 11, 2022  1828 CT imaging on my review and interpretation without evidence of perforation or obstruction.  Will await formal radiology report. [PR]  1903 Discussed the results of CT imaging with the patient.  She denies any vaginal bleeding no pelvic pain or discharge. [PR]  1924 Patient's rectal exam with dark brown stool that is weakly guaiac positive.  She not on any iron supplements.  She is feeling improved.  We discussed option for admission to the hospital overnight versus further observation here in the ER for repeat hemoglobin.  Patient would like to be discharged if possible we will continue to observe and reassess. [PR]  2329 Patient reassessed.  She feels significantly improved.  Her repeat hemoglobin is roughly unchanged especially considering delusional effect of fluids.  We discussed option for observation of hospital for additional hemoglobin and monitoring versus trial of outpatient follow-up.  She is not interested in hospitalization at this time.  Discussed her imaging results and the importance of following up with both GI and OB/GYN.  Patient is tolerating p.o.  Discussed signs and symptoms for which she should return to the ER. [PR]    Clinical Course User Index [PR] Merlyn Lot, MD     FINAL CLINICAL IMPRESSION(S) / ED DIAGNOSES   Final diagnoses:  Nausea and vomiting, unspecified vomiting type     Rx / DC Orders   ED  Discharge Orders          Ordered    ondansetron (ZOFRAN-ODT) 4 MG disintegrating tablet  Every 8 hours PRN        08/11/22 2324    pantoprazole (PROTONIX) 40 MG tablet  Daily        08/11/22 2324             Note:  This document was prepared using Dragon voice recognition software and may include unintentional dictation errors.    Merlyn Lot, MD 08/11/22 2330

## 2022-08-11 NOTE — ED Triage Notes (Signed)
C/o abdominal pain with n/v x5 days, with black tarry stools x3 days. Denies syncope, states feels light-headed when she vomits.

## 2022-08-11 NOTE — ED Notes (Signed)
Patient transported to Ultrasound 

## 2022-08-11 NOTE — ED Notes (Signed)
Pt given a lunch tray and ginger ale. Pt said her abd feels much better.

## 2022-08-18 ENCOUNTER — Ambulatory Visit: Payer: Medicaid Other

## 2022-08-18 ENCOUNTER — Ambulatory Visit (LOCAL_COMMUNITY_HEALTH_CENTER): Payer: Medicaid Other | Admitting: Nurse Practitioner

## 2022-08-18 VITALS — BP 137/95 | Ht 63.0 in | Wt 156.0 lb

## 2022-08-18 DIAGNOSIS — Z309 Encounter for contraceptive management, unspecified: Secondary | ICD-10-CM | POA: Diagnosis not present

## 2022-08-18 DIAGNOSIS — Z3009 Encounter for other general counseling and advice on contraception: Secondary | ICD-10-CM

## 2022-08-18 NOTE — Progress Notes (Signed)
Pt here for Nexplanon removal and reinsertion. RN confirmed with pt that her Nexplanon was placed 08/12/2019, and reviewed the updated guidelines for how long the Nexplanon is good for and that patient does not need to get it replaced at this time. Pt requested to speak with FNP White to verify. No other needs at this time.

## 2022-08-19 ENCOUNTER — Encounter: Payer: Self-pay | Admitting: Nurse Practitioner

## 2022-08-19 NOTE — Progress Notes (Signed)
Patient in clinic today for a Nexplanon removal today.  Nexplanon inserted 08/12/2019.  RP due 02/2023.  Informed patient that the Nexplanon was good for an additional year.  Patient comfortable with recommendation and has agreed to wait an additional year.  No other questions or concerns today.  STD screening offered. Patient declined.  Gregary Cromer, FNP

## 2022-09-06 ENCOUNTER — Encounter: Payer: Medicaid Other | Admitting: Obstetrics and Gynecology

## 2022-09-07 ENCOUNTER — Other Ambulatory Visit (HOSPITAL_COMMUNITY)
Admission: RE | Admit: 2022-09-07 | Discharge: 2022-09-07 | Disposition: A | Discharge status: Home / Self Care | Payer: Medicaid Other | Source: Ambulatory Visit | Attending: Obstetrics and Gynecology | Admitting: Obstetrics and Gynecology

## 2022-09-07 ENCOUNTER — Ambulatory Visit (INDEPENDENT_AMBULATORY_CARE_PROVIDER_SITE_OTHER): Payer: Medicaid Other | Admitting: Obstetrics

## 2022-09-07 ENCOUNTER — Encounter: Payer: Self-pay | Admitting: Obstetrics

## 2022-09-07 VITALS — BP 135/90 | HR 86

## 2022-09-07 DIAGNOSIS — Z124 Encounter for screening for malignant neoplasm of cervix: Secondary | ICD-10-CM

## 2022-09-07 NOTE — Progress Notes (Signed)
GYN ENCOUNTER  Subjective  HPI: Veronica Blackwell is a 41 y.o. [id number] who presents today for gynecological evaluation following a visit to the ED. She went to the ED on 08/11/22 for nausea and vomiting. Imaging on that date showed a thickened cervix. She denies any history of cervical lesions or surgery. She has a h/o one abnormal Pap in 2020 (+ HPV with normal cytology) but her Pap on 02/25/22 was normal and negative for HPV. She denies pelvic pain and abnormal bleeding or discharge. She endorses dyspareunia. She has a Nexplanon that was placed in April of this year. She has 3 cesarean births.  Past Medical History:  Diagnosis Date   Cocaine abuse ([place])    Genital herpes    Migraine    Past Surgical History:  Procedure Laterality Date   ARTERY REPAIR     L wrist   CESAREAN SECTION     CESAREAN SECTION N/A 05/23/2016   Procedure: CESAREAN SECTION;  Surgeon: Aletha Halim, MD;  Location: [facility];  Service: Obstetrics;  Laterality: N/A;   FOOT SURGERY     Right   OB History     Gravida  3   Para  3   Term  3   Preterm  0   AB  0   Living  3      SAB  0   IAB  0   Ectopic  0   Multiple  0   Live Births  3          Allergies  Allergen Reactions   Latex Anaphylaxis   ROS: Negative except as noted in HPI History obtained from the patient  Objective  BP (!) 135/90   Pulse 86   LMP 09/02/2022   General appearance: alert, cooperative, appears stated age Abdomen: soft, non-tender Pelvic: External genitalia normal, Vagina normal without discharge, cervix normal in appearance, Nabothian cysts present, no CMT, uterus normal size, shape, and consistency, Pap collected.  Assessment  Abnormal cervical thickening visualized on [place] and CT  Plan Consulted with Dr. Amalia Hailey. Pap smear and STI testing collected. Follow up based on results. Will consult with radiologist regarding findings.   Lloyd Huger, CNM

## 2022-09-12 LAB — CYTOLOGY - PAP
Chlamydia: NEGATIVE
Comment: NEGATIVE
Comment: NEGATIVE
Comment: NEGATIVE
Comment: NORMAL
Diagnosis: NEGATIVE
Diagnosis: REACTIVE
High risk HPV: NEGATIVE
Neisseria Gonorrhea: NEGATIVE
Trichomonas: NEGATIVE

## 2022-09-14 ENCOUNTER — Ambulatory Visit: Payer: Medicaid Other | Admitting: Family Medicine

## 2022-09-21 ENCOUNTER — Telehealth: Payer: Self-pay

## 2022-09-21 ENCOUNTER — Other Ambulatory Visit: Payer: Self-pay

## 2022-09-21 ENCOUNTER — Other Ambulatory Visit: Payer: Self-pay | Admitting: Obstetrics

## 2022-09-21 DIAGNOSIS — N760 Acute vaginitis: Secondary | ICD-10-CM

## 2022-09-21 MED ORDER — METRONIDAZOLE 500 MG PO TABS: 500.0000 mg | ORAL_TABLET | Freq: Two times a day (BID) | ORAL | 0 refills | Status: DC

## 2022-09-21 NOTE — Telephone Encounter (Signed)
Pt called triage returning our phone call. She is aware pap results and Rx for BV sent to pharmacy. She asked to res[facility] [place]. Rx resent.

## 2023-09-13 ENCOUNTER — Inpatient Hospital Stay (HOSPITAL_COMMUNITY): Payer: Medicaid Other

## 2023-09-13 ENCOUNTER — Encounter (HOSPITAL_COMMUNITY): Payer: Self-pay

## 2023-09-13 ENCOUNTER — Inpatient Hospital Stay (HOSPITAL_COMMUNITY)
Admission: EM | Admit: 2023-09-13 | Discharge: 2023-09-15 | DRG: 392 | Disposition: A | Discharge status: Home / Self Care | Payer: Medicaid Other | Attending: Internal Medicine | Admitting: Internal Medicine

## 2023-09-13 ENCOUNTER — Emergency Department (HOSPITAL_COMMUNITY): Payer: Medicaid Other

## 2023-09-13 ENCOUNTER — Other Ambulatory Visit: Payer: Self-pay

## 2023-09-13 DIAGNOSIS — Z1389 Encounter for screening for other disorder: Secondary | ICD-10-CM | POA: Diagnosis not present

## 2023-09-13 DIAGNOSIS — D125 Benign neoplasm of sigmoid colon: Secondary | ICD-10-CM | POA: Diagnosis not present

## 2023-09-13 DIAGNOSIS — R1030 Lower abdominal pain, unspecified: Secondary | ICD-10-CM | POA: Diagnosis not present

## 2023-09-13 DIAGNOSIS — Z5941 Food insecurity: Secondary | ICD-10-CM | POA: Diagnosis not present

## 2023-09-13 DIAGNOSIS — K639 Disease of intestine, unspecified: Secondary | ICD-10-CM

## 2023-09-13 DIAGNOSIS — K635 Polyp of colon: Secondary | ICD-10-CM

## 2023-09-13 DIAGNOSIS — K529 Noninfective gastroenteritis and colitis, unspecified: Secondary | ICD-10-CM | POA: Diagnosis present

## 2023-09-13 DIAGNOSIS — E876 Hypokalemia: Secondary | ICD-10-CM | POA: Diagnosis not present

## 2023-09-13 DIAGNOSIS — F17210 Nicotine dependence, cigarettes, uncomplicated: Secondary | ICD-10-CM | POA: Diagnosis present

## 2023-09-13 DIAGNOSIS — K6389 Other specified diseases of intestine: Secondary | ICD-10-CM | POA: Diagnosis present

## 2023-09-13 DIAGNOSIS — A6009 Herpesviral infection of other urogenital tract: Secondary | ICD-10-CM | POA: Diagnosis not present

## 2023-09-13 DIAGNOSIS — G43909 Migraine, unspecified, not intractable, without status migrainosus: Secondary | ICD-10-CM | POA: Diagnosis present

## 2023-09-13 DIAGNOSIS — R109 Unspecified abdominal pain: Secondary | ICD-10-CM | POA: Diagnosis present

## 2023-09-13 DIAGNOSIS — R1084 Generalized abdominal pain: Secondary | ICD-10-CM

## 2023-09-13 DIAGNOSIS — K648 Other hemorrhoids: Secondary | ICD-10-CM | POA: Diagnosis present

## 2023-09-13 DIAGNOSIS — K449 Diaphragmatic hernia without obstruction or gangrene: Secondary | ICD-10-CM | POA: Diagnosis not present

## 2023-09-13 DIAGNOSIS — Z833 Family history of diabetes mellitus: Secondary | ICD-10-CM | POA: Diagnosis not present

## 2023-09-13 DIAGNOSIS — F1210 Cannabis abuse, uncomplicated: Secondary | ICD-10-CM | POA: Diagnosis present

## 2023-09-13 DIAGNOSIS — K2100 Gastro-esophageal reflux disease with esophagitis, without bleeding: Secondary | ICD-10-CM | POA: Diagnosis not present

## 2023-09-13 DIAGNOSIS — Z79899 Other long term (current) drug therapy: Secondary | ICD-10-CM

## 2023-09-13 DIAGNOSIS — R823 Hemoglobinuria: Secondary | ICD-10-CM | POA: Diagnosis not present

## 2023-09-13 DIAGNOSIS — Z841 Family history of disorders of kidney and ureter: Secondary | ICD-10-CM | POA: Diagnosis not present

## 2023-09-13 DIAGNOSIS — D649 Anemia, unspecified: Secondary | ICD-10-CM | POA: Diagnosis not present

## 2023-09-13 DIAGNOSIS — F1410 Cocaine abuse, uncomplicated: Secondary | ICD-10-CM | POA: Diagnosis present

## 2023-09-13 DIAGNOSIS — K859 Acute pancreatitis without necrosis or infection, unspecified: Principal | ICD-10-CM

## 2023-09-13 DIAGNOSIS — R1013 Epigastric pain: Secondary | ICD-10-CM | POA: Diagnosis not present

## 2023-09-13 DIAGNOSIS — K8689 Other specified diseases of pancreas: Secondary | ICD-10-CM | POA: Diagnosis not present

## 2023-09-13 DIAGNOSIS — R933 Abnormal findings on diagnostic imaging of other parts of digestive tract: Secondary | ICD-10-CM | POA: Diagnosis not present

## 2023-09-13 DIAGNOSIS — I700 Atherosclerosis of aorta: Secondary | ICD-10-CM | POA: Diagnosis present

## 2023-09-13 DIAGNOSIS — Z98891 History of uterine scar from previous surgery: Secondary | ICD-10-CM

## 2023-09-13 DIAGNOSIS — R1010 Upper abdominal pain, unspecified: Secondary | ICD-10-CM | POA: Diagnosis not present

## 2023-09-13 DIAGNOSIS — Z87892 Personal history of anaphylaxis: Secondary | ICD-10-CM

## 2023-09-13 DIAGNOSIS — R112 Nausea with vomiting, unspecified: Secondary | ICD-10-CM

## 2023-09-13 DIAGNOSIS — Z91040 Latex allergy status: Secondary | ICD-10-CM | POA: Diagnosis not present

## 2023-09-13 DIAGNOSIS — K2970 Gastritis, unspecified, without bleeding: Principal | ICD-10-CM | POA: Diagnosis present

## 2023-09-13 DIAGNOSIS — R748 Abnormal levels of other serum enzymes: Secondary | ICD-10-CM | POA: Diagnosis present

## 2023-09-13 DIAGNOSIS — Z8249 Family history of ischemic heart disease and other diseases of the circulatory system: Secondary | ICD-10-CM | POA: Diagnosis not present

## 2023-09-13 DIAGNOSIS — Z9889 Other specified postprocedural states: Secondary | ICD-10-CM

## 2023-09-13 DIAGNOSIS — K2900 Acute gastritis without bleeding: Secondary | ICD-10-CM | POA: Diagnosis not present

## 2023-09-13 LAB — URINALYSIS, ROUTINE W REFLEX MICROSCOPIC
Bilirubin Urine: NEGATIVE
Glucose, UA: NEGATIVE mg/dL
Ketones, ur: NEGATIVE mg/dL
Leukocytes,Ua: NEGATIVE
Nitrite: NEGATIVE
Protein, ur: NEGATIVE mg/dL
Specific Gravity, Urine: 1.02 (ref 1.005–1.030)
pH: 5 (ref 5.0–8.0)

## 2023-09-13 LAB — PREGNANCY, URINE: Preg Test, Ur: NEGATIVE

## 2023-09-13 LAB — CBC WITH DIFFERENTIAL/PLATELET
Abs Immature Granulocytes: 0.02 10*3/uL (ref 0.00–0.07)
Basophils Absolute: 0.1 10*3/uL (ref 0.0–0.1)
Basophils Relative: 1 %
Eosinophils Absolute: 0.1 10*3/uL (ref 0.0–0.5)
Eosinophils Relative: 1 %
HCT: 43.9 % (ref 36.0–46.0)
Hemoglobin: 14.5 g/dL (ref 12.0–15.0)
Immature Granulocytes: 0 %
Lymphocytes Relative: 16 %
Lymphs Abs: 1.5 10*3/uL (ref 0.7–4.0)
MCH: 30.1 pg (ref 26.0–34.0)
MCHC: 33 g/dL (ref 30.0–36.0)
MCV: 91.3 fL (ref 80.0–100.0)
Monocytes Absolute: 0.5 10*3/uL (ref 0.1–1.0)
Monocytes Relative: 5 %
Neutro Abs: 7.5 10*3/uL (ref 1.7–7.7)
Neutrophils Relative %: 77 %
Platelets: 225 10*3/uL (ref 150–400)
RBC: 4.81 MIL/uL (ref 3.87–5.11)
RDW: 13.9 % (ref 11.5–15.5)
WBC: 9.6 10*3/uL (ref 4.0–10.5)
nRBC: 0 % (ref 0.0–0.2)

## 2023-09-13 LAB — COMPREHENSIVE METABOLIC PANEL
ALT: 12 U/L (ref 0–44)
AST: 16 U/L (ref 15–41)
Albumin: 4.4 g/dL (ref 3.5–5.0)
Alkaline Phosphatase: 67 U/L (ref 38–126)
Anion gap: 14 (ref 5–15)
BUN: 6 mg/dL (ref 6–20)
CO2: 30 mmol/L (ref 22–32)
Calcium: 9.6 mg/dL (ref 8.9–10.3)
Chloride: 96 mmol/L — ABNORMAL LOW (ref 98–111)
Creatinine, Ser: 0.66 mg/dL (ref 0.44–1.00)
GFR, Estimated: >60 mL/min (ref >60)
Glucose, Bld: 111 mg/dL — ABNORMAL HIGH (ref 70–99)
Potassium: 2.8 mmol/L — ABNORMAL LOW (ref 3.5–5.1)
Sodium: 140 mmol/L (ref 135–145)
Total Bilirubin: 0.6 mg/dL (ref <1.2)
Total Protein: 9 g/dL — ABNORMAL HIGH (ref 6.5–8.1)

## 2023-09-13 LAB — TRIGLYCERIDES: Triglycerides: 209 mg/dL — ABNORMAL HIGH (ref <150)

## 2023-09-13 LAB — ETHANOL: Alcohol, Ethyl (B): <10 mg/dL (ref <10)

## 2023-09-13 LAB — LIPASE, BLOOD: Lipase: 70 U/L — ABNORMAL HIGH (ref 11–51)

## 2023-09-13 MED ORDER — POTASSIUM CHLORIDE IN NACL 20-0.9 MEQ/L-% IV SOLN
Freq: Once | INTRAVENOUS | Status: AC
  Filled 2023-09-13: qty 1000

## 2023-09-13 MED ORDER — POTASSIUM CHLORIDE 10 MEQ/100ML IV SOLN
10.0000 meq | INTRAVENOUS | Status: AC
  Administered 2023-09-13 (×3): 10 meq via INTRAVENOUS
  Filled 2023-09-13 (×2): qty 100

## 2023-09-13 MED ORDER — SUCRALFATE 1 GM/10ML PO SUSP
1.0000 g | Freq: Once | ORAL | Status: AC
  Administered 2023-09-13: 1 g via ORAL
  Filled 2023-09-13: qty 10

## 2023-09-13 MED ORDER — SODIUM CHLORIDE 0.9 % IV BOLUS
1000.0000 mL | Freq: Once | INTRAVENOUS | Status: AC
  Administered 2023-09-13: 1000 mL via INTRAVENOUS

## 2023-09-13 MED ORDER — ONDANSETRON HCL 4 MG PO TABS: 4.0000 mg | ORAL_TABLET | Freq: Four times a day (QID) | ORAL | Status: DC | PRN

## 2023-09-13 MED ORDER — LACTATED RINGERS IV SOLN: INTRAVENOUS | Status: DC

## 2023-09-13 MED ORDER — ONDANSETRON HCL 4 MG/2ML IJ SOLN
4.0000 mg | Freq: Once | INTRAMUSCULAR | Status: AC
  Administered 2023-09-13: 4 mg via INTRAVENOUS
  Filled 2023-09-13: qty 2

## 2023-09-13 MED ORDER — IOHEXOL 300 MG/ML  SOLN
100.0000 mL | Freq: Once | INTRAMUSCULAR | Status: AC | PRN
  Administered 2023-09-13: 100 mL via INTRAVENOUS

## 2023-09-13 MED ORDER — ENOXAPARIN SODIUM 40 MG/0.4ML IJ SOSY
40.0000 mg | PREFILLED_SYRINGE | INTRAMUSCULAR | Status: DC
  Administered 2023-09-13: 40 mg via SUBCUTANEOUS
  Filled 2023-09-13: qty 0.4

## 2023-09-13 MED ORDER — ONDANSETRON HCL 4 MG/2ML IJ SOLN
4.0000 mg | Freq: Four times a day (QID) | INTRAMUSCULAR | Status: DC | PRN
Start: 2023-09-13 — End: 2023-09-15
  Administered 2023-09-14 – 2023-09-15 (×3): 4 mg via INTRAVENOUS
  Filled 2023-09-13 (×3): qty 2

## 2023-09-13 MED ORDER — ACETAMINOPHEN 650 MG RE SUPP: 650.0000 mg | Freq: Four times a day (QID) | RECTAL | Status: DC | PRN

## 2023-09-13 MED ORDER — SENNOSIDES-DOCUSATE SODIUM 8.6-50 MG PO TABS: 1.0000 | ORAL_TABLET | Freq: Every evening | ORAL | Status: DC | PRN

## 2023-09-13 MED ORDER — FAMOTIDINE IN NACL 20-0.9 MG/50ML-% IV SOLN
20.0000 mg | Freq: Once | INTRAVENOUS | Status: AC
  Administered 2023-09-13: 20 mg via INTRAVENOUS
  Filled 2023-09-13: qty 50

## 2023-09-13 MED ORDER — MORPHINE SULFATE (PF) 4 MG/ML IV SOLN
4.0000 mg | Freq: Once | INTRAVENOUS | Status: AC
  Administered 2023-09-13: 4 mg via INTRAVENOUS
  Filled 2023-09-13: qty 1

## 2023-09-13 MED ORDER — PANTOPRAZOLE SODIUM 40 MG PO TBEC
40.0000 mg | DELAYED_RELEASE_TABLET | Freq: Every day | ORAL | Status: DC
  Administered 2023-09-13 – 2023-09-14 (×2): 40 mg via ORAL
  Filled 2023-09-13 (×2): qty 1

## 2023-09-13 MED ORDER — HYDROMORPHONE HCL 1 MG/ML IJ SOLN
0.5000 mg | INTRAMUSCULAR | Status: DC | PRN
  Administered 2023-09-13: 0.5 mg via INTRAVENOUS
  Filled 2023-09-13: qty 0.5

## 2023-09-13 MED ORDER — GADOBUTROL 1 MMOL/ML IV SOLN
7.0000 mL | Freq: Once | INTRAVENOUS | Status: AC | PRN
  Administered 2023-09-13: 7 mL via INTRAVENOUS

## 2023-09-13 MED ORDER — ACETAMINOPHEN 325 MG PO TABS: 650.0000 mg | ORAL_TABLET | Freq: Four times a day (QID) | ORAL | Status: DC | PRN

## 2023-09-13 MED ORDER — NICOTINE 14 MG/24HR TD PT24
14.0000 mg | MEDICATED_PATCH | Freq: Every day | TRANSDERMAL | Status: DC
  Administered 2023-09-13 – 2023-09-14 (×3): 14 mg via TRANSDERMAL
  Filled 2023-09-13 (×3): qty 1

## 2023-09-13 NOTE — ED Notes (Signed)
ED TO INPATIENT HANDOFF REPORT  ED Nurse Name and Phone #: Toy Cookey, RN  S Name/Age/Gender Veronica Blackwell 42 y.o. female Room/Bed: WA21/WA21  Code Status   Code Status: Full Code  Home/SNF/Other Home Patient oriented to: self, place, time, and situation Is this baseline? Yes   Triage Complete: Triage complete  Chief Complaint Acute pancreatitis [K85.90]  Triage Note Pt c/o lower abdominal pain and n/v x1 week.  Pain score 10/10.  Hx of ovarian cysts.     After completing triage, Pt mentioned sometimes the pain is in her upper abdomen as well.    Allergies Allergies  Allergen Reactions   Latex Anaphylaxis    Level of Care/Admitting Diagnosis ED Disposition     ED Disposition  Admit   Condition  --   Comment  Hospital Area: [facility] [[id number]]  Level of Care: Med-Surg [16]  May admit patient to [facility] or [facility] if equivalent level of care is available:: No  Covid Evaluation: Asymptomatic - no recent exposure (last 10 days) testing not required  Diagnosis: Acute pancreatitis [577.0.ICD-9-CM]  Admitting Physician: Steffanie Rainwater [[id number]]  Attending Physician: Steffanie Rainwater [[id number]]  Certification:: I certify this patient will need inpatient services for at least 2 midnights  Expected Medical Readiness: 09/15/2023          B Medical/Surgery History Past Medical History:  Diagnosis Date   Cocaine abuse ([facility])    Genital herpes    Migraine    Past Surgical History:  Procedure Laterality Date   ARTERY REPAIR     L wrist   CESAREAN SECTION     CESAREAN SECTION N/A 05/23/2016   Procedure: CESAREAN SECTION;  Surgeon: [place] Bing, MD;  [facility];  Service: Obstetrics;  Laterality: N/A;   FOOT SURGERY     Right     A IV Location/Drains/Wounds Patient Lines/Drains/Airways Status     Active Line/Drains/Airways     Name Placement date Placement time Site Days   Peripheral IV 09/13/23 20 G  1.75" Right Antecubital 09/13/23  1141  Antecubital  less than 1   Incision (Closed) 05/23/16 Abdomen Other (Comment) 05/23/16  1103  -- 2669            Intake/Output Last 24 hours No intake or output data in the 24 hours ending 09/13/23 1707  Labs/Imaging Results for orders placed or performed during the hospital encounter of 09/13/23 (from the past 48 hour(s))  Comprehensive metabolic panel     Status: Abnormal   Collection Time: 09/13/23 10:36 AM  Result Value Ref Range   Sodium 140 135 - 145 mmol/L   Potassium 2.8 (L) 3.5 - 5.1 mmol/L   Chloride 96 (L) 98 - 111 mmol/L   CO2 30 22 - 32 mmol/L   Glucose, Bld 111 (H) 70 - 99 mg/dL    Comment: Glucose reference range applies only to samples taken after fasting for at least 8 hours.   BUN 6 6 - 20 mg/dL   Creatinine, Ser 0.86 0.44 - 1.00 mg/dL   Calcium 9.6 8.9 - 57.8 mg/dL   Total Protein 9.0 (H) 6.5 - 8.1 g/dL   Albumin 4.4 3.5 - 5.0 g/dL   AST 16 15 - 41 U/L   ALT 12 0 - 44 U/L   Alkaline Phosphatase 67 38 - 126 U/L   Total Bilirubin 0.6 <1.2 mg/dL   GFR, Estimated >46 >96 mL/min    Comment: (NOTE) Calculated using the CKD-EPI  Creatinine Equation (2021)    Anion gap 14 5 - 15    Comment: Performed at [facility], [address], [place], [place] [address]  Ethanol     Status: None   Collection Time: 09/13/23 10:36 AM  Result Value Ref Range   Alcohol, Ethyl (B) <10 <10 mg/dL    Comment: (NOTE) Lowest detectable limit for serum alcohol is 10 mg/dL.  For medical purposes only. Performed at [facility], [address], [place], [place] [address]   Lipase, blood     Status: Abnormal   Collection Time: 09/13/23 10:36 AM  Result Value Ref Range   Lipase 70 (H) 11 - 51 U/L    Comment: Performed at [facility], [address] [place], [place] [address]  CBC with Differential     Status: None   Collection Time: 09/13/23 10:36 AM  Result Value Ref Range    WBC 9.6 4.0 - 10.5 K/uL   RBC 4.81 3.87 - 5.11 MIL/uL   Hemoglobin 14.5 12.0 - 15.0 g/dL   HCT 59.5 63.8 - 75.6 %   MCV 91.3 80.0 - 100.0 fL   MCH 30.1 26.0 - 34.0 pg   MCHC 33.0 30.0 - 36.0 g/dL   RDW 43.3 29.5 - 18.8 %   Platelets 225 150 - 400 K/uL   nRBC 0.0 0.0 - 0.2 %   Neutrophils Relative % 77 %   Neutro Abs 7.5 1.7 - 7.7 K/uL   Lymphocytes Relative 16 %   Lymphs Abs 1.5 0.7 - 4.0 K/uL   Monocytes Relative 5 %   Monocytes Absolute 0.5 0.1 - 1.0 K/uL   Eosinophils Relative 1 %   Eosinophils Absolute 0.1 0.0 - 0.5 K/uL   Basophils Relative 1 %   Basophils Absolute 0.1 0.0 - 0.1 K/uL   Immature Granulocytes 0 %   Abs Immature Granulocytes 0.02 0.00 - 0.07 K/uL    Comment: Performed at [facility], [address], [place], [place] [address]  Triglycerides     Status: Abnormal   Collection Time: 09/13/23 10:36 AM  Result Value Ref Range   Triglycerides 209 (H) <150 mg/dL    Comment: Performed at [facility], [address], [place], [place] [address]  Urinalysis, Routine w reflex microscopic -Urine, Clean Catch     Status: Abnormal   Collection Time: 09/13/23 10:36 AM  Result Value Ref Range   Color, Urine YELLOW YELLOW   APPearance HAZY (A) CLEAR   Specific Gravity, Urine 1.020 1.005 - 1.030   pH 5.0 5.0 - 8.0   Glucose, UA NEGATIVE NEGATIVE mg/dL   Hgb urine dipstick MODERATE (A) NEGATIVE   Bilirubin Urine NEGATIVE NEGATIVE   Ketones, ur NEGATIVE NEGATIVE mg/dL   Protein, ur NEGATIVE NEGATIVE mg/dL   Nitrite NEGATIVE NEGATIVE   Leukocytes,Ua NEGATIVE NEGATIVE   RBC / HPF 0-5 0 - 5 RBC/hpf   WBC, UA 6-10 0 - 5 WBC/hpf   Bacteria, UA RARE (A) NONE SEEN   Squamous Epithelial / HPF 6-10 0 - 5 /HPF   Mucus PRESENT     Comment: Performed at [facility], [address], [place], [place] [address]  Pregnancy, urine     Status: None   Collection Time: 09/13/23 10:36 AM  Result Value Ref Range   Preg Test, Ur  NEGATIVE NEGATIVE    Comment:        THE SENSITIVITY OF THIS METHODOLOGY IS >25  mIU/mL. Performed at [facility], [address] [place], [place] [address]    CT ABDOMEN PELVIS W CONTRAST  Result Date: 09/13/2023 CLINICAL DATA:  One-week history of lower abdominal pain with nausea and vomiting EXAM: CT ABDOMEN AND PELVIS WITH CONTRAST TECHNIQUE: Multidetector CT imaging of the abdomen and pelvis was performed using the standard protocol following bolus administration of intravenous contrast. RADIATION DOSE REDUCTION: This exam was performed according to the departmental dose-optimization program which includes automated exposure control, adjustment of the mA and/or kV according to patient size and/or use of iterative reconstruction technique. CONTRAST:  OMNIPAQUE IOHEXOL 300 MG/ML  SOLN COMPARISON:  CT abdomen and pelvis dated 08/11/2022, ultrasound pelvis dated 08/11/2022 FINDINGS: Lower chest: No focal consolidation or pulmonary nodule in the lung bases. No pleural effusion or pneumothorax demonstrated. Partially imaged heart size is normal. Hepatobiliary: No focal hepatic lesions. No intra or extrahepatic biliary ductal dilation. Normal gallbladder. Pancreas: Diffusely prominent pancreatic duct measures up to 5 mm, previously 3 mm. Spleen: Normal in size without focal abnormality. Adrenals/Urinary Tract: No adrenal nodules. No suspicious renal mass, calculi or hydronephrosis. No focal bladder wall thickening. Stomach/Bowel: Normal appearance of the stomach. No abnormal bowel dilation. Mucosal hyperenhancement and mural thickening of the ascending and transverse colon. Normal appendix. Vascular/Lymphatic: Aortic atherosclerosis. No enlarged abdominal or pelvic lymph nodes. Reproductive: No adnexal masses. Other: No free fluid, fluid collection, or free air. Musculoskeletal: No acute or abnormal lytic or blastic osseous lesions. Multilevel degenerative changes of the partially  imaged thoracic and lumbar spine. IMPRESSION: 1. Mucosal hyperenhancement and mural thickening of the ascending and transverse colon, which may be seen in the setting of infectious or inflammatory colitis. 2. Diffusely prominent pancreatic duct measures up to 5 mm, previously 3 mm. Recommend further evaluation with nonemergent MRCP. 3.  Aortic Atherosclerosis (ICD10-I70.0). Electronically Signed   By: Agustin Cree M.D.   On: 09/13/2023 16:01    Pending Labs Unresulted Labs (From admission, onward)     Start     Ordered   09/13/23 1659  HIV Antibody (routine testing w rflx)  (HIV Antibody (Routine testing w reflex) panel)  Once,   R        09/13/23 1700            Vitals/Pain Today's Vitals   09/13/23 1025 09/13/23 1357 09/13/23 1358 09/13/23 1500  BP: (!) 169/111  (!) 174/90 (!) 192/97  Pulse: 66  65 62  Resp: 16  11 14   Temp: 98.1 F (36.7 C)  98.5 F (36.9 C)   TempSrc: Oral  Oral   SpO2: 100%  100% 100%  Weight:      Height:      PainSc:  8       Isolation Precautions No active isolations  Medications Medications  potassium chloride 10 mEq in 100 mL IVPB (10 mEq Intravenous New Bag/Given 09/13/23 1702)  enoxaparin (LOVENOX) injection 40 mg (has no administration in time range)  acetaminophen (TYLENOL) tablet 650 mg (has no administration in time range)    Or  acetaminophen (TYLENOL) suppository 650 mg (has no administration in time range)  senna-docusate (Senokot-S) tablet 1 tablet (has no administration in time range)  ondansetron (ZOFRAN) tablet 4 mg (has no administration in time range)    Or  ondansetron (ZOFRAN) injection 4 mg (has no administration in time range)  lactated ringers infusion (has no administration in time range)  pantoprazole (PROTONIX) EC tablet 40 mg (has no administration in time range)  HYDROmorphone (DILAUDID) injection 0.5 mg (has no administration in time range)  sodium chloride 0.9 % bolus 1,000 mL (0 mLs Intravenous Stopped 09/13/23 1704)   morphine (PF) 4 MG/ML injection 4 mg (4 mg Intravenous Given 09/13/23 1141)  ondansetron (ZOFRAN) injection 4 mg (4 mg Intravenous Given 09/13/23 1145)  sucralfate (CARAFATE) 1 GM/10ML suspension 1 g (1 g Oral Given 09/13/23 1147)  famotidine (PEPCID) IVPB 20 mg premix (0 mg Intravenous Stopped 09/13/23 1704)  0.9 % NaCl with KCl 20 mEq/ L  infusion (0 mL/hr Intravenous Stopped 09/13/23 1704)  iohexol (OMNIPAQUE) 300 MG/ML solution 100 mL (100 mLs Intravenous Contrast Given 09/13/23 1333)  morphine (PF) 4 MG/ML injection 4 mg (4 mg Intravenous Given 09/13/23 1700)    Mobility walks with person assist     Focused Assessments Abdominal Pain; Hypokalemia   R Recommendations: See Admitting Provider Note  Report given to:   Additional Notes: .

## 2023-09-13 NOTE — H&P (Addendum)
History and Physical    Patient: Veronica Blackwell ZOX:[id number] DOB: Jun 17, 1981 DOA: 09/13/2023 DOS: the patient was seen and examined on 09/13/2023 PCP: Patient, No Pcp Per  Patient coming from: Home  Chief Complaint:  Chief Complaint  Patient presents with   Abdominal Pain   Emesis   HPI: Veronica Blackwell is a 43 y.o. female with medical history significant of migraines and ovarian cysts who presented to the ED for relation of epigastric pain and/V. Patient was in her usual state of health until a week ago when she started having nausea vomiting. This persisted throughout the week with intermittent abdominal discomfort. This morning, she had worsening of the nausea vomiting as well as abdominal pain. The pain is located in her epigastrium and described as burning. She denies any diarrhea, dysuria, headaches, dizziness, chest pain, back pain, fevers or chills.  ED course: Hypertensive with SBP in the 130s to 190s but hemodynamically stable.  Labs showed K+ 2.8, creatinine 0.66, AST/ALT 16/12, alk phos 67, bili 0.6, normal ethanol levels, lipase 70, WBC 9.6, Hgb 14.3, platelet 225, triglycerides 209, UA with moderate hemoglobinuria, negative pregnancy test.  CT A/P diffusely prominent pancreatic duct with increased size from 3 mm to 5 mm as well as mucosal hyperenhancement and mural thickening of the ascending and transverse colon, usually seen in infectious or inflammatory colitis. Patient received IV fluid bolus, IV Pepcid, morphine, Carafate and IV.  Hospitalist consulted for admission.  Review of Systems: As mentioned in the history of present illness. All other systems reviewed and are negative. Past Medical History:  Diagnosis Date   Cocaine abuse ([facility]CC)    Genital herpes    Migraine    Past Surgical History:  Procedure Laterality Date   ARTERY REPAIR     L wrist   CESAREAN SECTION     CESAREAN SECTION N/A 05/23/2016   Procedure: CESAREAN SECTION;  Surgeon: [place] Bing, MD;   [facility];  Service: Obstetrics;  Laterality: N/A;   FOOT SURGERY     Right   Social History: Patient states she lives with her spouse and children.  She smokes 1/2ppd of cigarettes and has been smoking for the past 25 years.  He denies any EtOH use.  Smokes marijuana daily but denies any other illicit drugs.  Allergies  Allergen Reactions   Latex Anaphylaxis    Family History  Problem Relation Age of Onset   Healthy Father    Diabetes Mother    Heart disease Mother    Kidney disease Mother    Early death Mother 37       heart failure   Hypertension Mother    Healthy Brother    Healthy Sister     Prior to Admission medications   Medication Sig Start Date End Date Taking? Authorizing Provider  metroNIDAZOLE (FLAGYL) 500 MG tablet Take 1 tablet (500 mg total) by mouth 2 (two) times daily. 09/21/22   Glenetta Borg, CNM  ondansetron (ZOFRAN-ODT) 4 MG disintegrating tablet Take 1 tablet (4 mg total) by mouth every 8 (eight) hours as needed for nausea or vomiting. 08/11/22   Willy Eddy, MD  pantoprazole (PROTONIX) 40 MG tablet Take 1 tablet (40 mg total) by mouth daily. 08/11/22 08/11/23  Willy Eddy, MD  valACYclovir (VALTREX) 500 MG tablet Take 1 tablet (500 mg total) by mouth 2 (two) times daily. 02/25/22   Alberteen Spindle, CNM    Physical Exam: Vitals:   09/13/23 1025 09/13/23 1358 09/13/23 1500  09/13/23 1709  BP: (!) 169/111 (!) 174/90 (!) 192/97   Pulse: 66 65 62 (!) 55  Resp: 16 11 14 18   Temp: 98.1 F (36.7 C) 98.5 F (36.9 C)  99.1 F (37.3 C)  TempSrc: Oral Oral    SpO2: 100% 100% 100% 100%  Weight:      Height:       General: Pleasant, uncomfortable appearing middle-age woman laying in bed. Head: Normocephalic. Atraumatic. CV: RRR. No murmurs, rubs, or gallops. No LE edema Pulmonary: Lungs CTAB. Normal effort. No wheezing or rales. Abdominal: Soft. Mild epigastric tenderness.  Nontender.  Normal bowel sounds. Extremities:  Palpable radial and DP pulses. Normal ROM. Skin: Warm and dry. No obvious rash or lesions. Neuro: A&Ox3. Moves all extremities. Normal sensation. No focal deficit. Psych: Normal mood and affect  Data Reviewed:  Lipase 70, normal white count, LFTs and hemoglobin K+ 2.8 EKG with normal sinus rhythm  Assessment and Plan: Veronica Blackwell is a 42 y.o. female with medical history significant of migraines and ovarian cysts who presented to the ED for relation of epigastric pain, nausea and vomiting and admitted for acute pancreatitis.  # Acute pancreatitis Young patient presented with 1 week of N/V with intermittent epigastric pain found to have mildly elevated lipase to 70. Labs showed normal WBC, LFTs, bilirubin, alk phos and ethanol. Triglyceride mildly elevated to 209. CT A/P shows diffusedly enlarged pancreatic duct up to 5 mm concerning for possible pancreatitis. Patient with no evidence of gallstones on imaging. She denies any EtOH use or recent trauma to the abdomen. She is not on any medication or drugs known to cause pancreatitis. Will provide IV hydration, pain control and get MRCP to further evaluate.  -F/u MRCP -IV LR 150 cc/h -Pain control with IV Dilaudid -PRN zofran for N/V -Protonix 40 mg daily -Trend CBCs, fever curve -Advance diet as tolerated  #Hypokalemia K+ normal at 2.8 in the setting of 1 week of nausea and vomiting. -Repleting -F/u AM mag, K+  ?Inflammatory colitis CT A/P on admission showed mucosal hyperenhancement and mural thickening of the ascending and transverse colon, which may be seen in the setting of infectious or inflammatory colitis. Patient without any signs of active infection. She denies any history of diarrhea and no family history of inflammatory bowel disease. -CTM  # Tobacco use disorder Patient reports smoking half a pack of cigarettes per day.  She has been smoking for about 25 years.  -Nicotine patch 14 mg every 24 hrs Smoking cessation  counseling for 4 minutes today,   I have discussed tobacco cessation with the patient.  I have counseled the patient regarding the negative impacts of continued tobacco use including but not limited to lung cancer, COPD, and cardiovascular disease.  I have discussed alternatives to tobacco and modalities that may help facilitate tobacco cessation including but not limited to biofeedback, hypnosis, and medications.  Total time spent with tobacco counseling was 4 minutes.   Advance Care Planning:   Code Status: Full Code   Consults: None  Family Communication: Discussed admission with spouse at bedside  Severity of Illness: The appropriate patient status for this patient is INPATIENT. Inpatient status is judged to be reasonable and necessary in order to provide the required intensity of service to ensure the patient's safety. The patient's presenting symptoms, physical exam findings, and initial radiographic and laboratory data in the context of their chronic comorbidities is felt to place them at high risk for further clinical deterioration. Furthermore, it  is not anticipated that the patient will be medically stable for discharge from the hospital within 2 midnights of admission.   * I certify that at the point of admission it is my clinical judgment that the patient will require inpatient hospital care spanning beyond 2 midnights from the point of admission due to high intensity of service, high risk for further deterioration and high frequency of surveillance required.*  Author: Steffanie Rainwater, MD 09/13/2023 5:53 PM  For on call review [email or web address].

## 2023-09-13 NOTE — ED Notes (Signed)
[facility] called for transport. 

## 2023-09-13 NOTE — ED Provider Notes (Signed)
[place] EMERGENCY DEPARTMENT AT [facility] Provider Note   CSN: [id number] Arrival date & time: 09/13/23  1006     History  Chief Complaint  Patient presents with   Abdominal Pain   Emesis    Veronica Blackwell is a 42 y.o. female.  HPI Patient presents with epigastric pain 10/10.  Onset was about 1 week ago, since that time pain has been persistent, severe, worsening with associated anorexia, nausea, vomiting, particular after p.o. intake. Patient smokes, does not drink, was well prior to the onset, no obvious precipitant.  No relief with anything since onset.  She notes a history of ovarian cysts, diagnosed about 1 year ago, though that episode had lower abdominal pain and she has none currently.     Home Medications Prior to Admission medications   Medication Sig Start Date End Date Taking? Authorizing Provider  metroNIDAZOLE (FLAGYL) 500 MG tablet Take 1 tablet (500 mg total) by mouth 2 (two) times daily. 09/21/22   Glenetta Borg, CNM  ondansetron (ZOFRAN-ODT) 4 MG disintegrating tablet Take 1 tablet (4 mg total) by mouth every 8 (eight) hours as needed for nausea or vomiting. 08/11/22   Willy Eddy, MD  pantoprazole (PROTONIX) 40 MG tablet Take 1 tablet (40 mg total) by mouth daily. 08/11/22 08/11/23  Willy Eddy, MD  valACYclovir (VALTREX) 500 MG tablet Take 1 tablet (500 mg total) by mouth 2 (two) times daily. 02/25/22   Sciora, Austin Miles, CNM      Allergies    Latex    Review of Systems   Review of Systems  Physical Exam Updated Vital Signs BP (!) 192/97   Pulse 62   Temp 98.5 F (36.9 C) (Oral)   Resp 14   Ht 5\' 3"  (1.6 m)   Wt 70.8 kg   SpO2 100%   BMI 27.63 kg/m  Physical Exam Vitals and nursing note reviewed.  Constitutional:      General: She is not in acute distress.    Appearance: She is well-developed.     Comments: Uncomfortable appearing adult female awake and alert  HENT:     Head: Normocephalic and atraumatic.   Eyes:     Conjunctiva/sclera: Conjunctivae normal.  Cardiovascular:     Rate and Rhythm: Normal rate and regular rhythm.  Pulmonary:     Effort: Pulmonary effort is normal. No respiratory distress.     Breath sounds: Normal breath sounds. No stridor.  Abdominal:     General: There is no distension.     Tenderness: There is abdominal tenderness in the epigastric area.  Skin:    General: Skin is warm and dry.  Neurological:     Mental Status: She is alert and oriented to person, place, and time.     Cranial Nerves: No cranial nerve deficit.  Psychiatric:        Mood and Affect: Mood normal.     ED Results / Procedures / Treatments   Labs (all labs ordered are listed, but only abnormal results are displayed) Labs Reviewed  COMPREHENSIVE METABOLIC PANEL - Abnormal; Notable for the following components:      Result Value   Potassium 2.8 (*)    Chloride 96 (*)    Glucose, Bld 111 (*)    Total Protein 9.0 (*)    All other components within normal limits  LIPASE, BLOOD - Abnormal; Notable for the following components:   Lipase 70 (*)    All other components within normal limits  TRIGLYCERIDES - Abnormal; Notable for the following components:   Triglycerides 209 (*)    All other components within normal limits  URINALYSIS, ROUTINE W REFLEX MICROSCOPIC - Abnormal; Notable for the following components:   APPearance HAZY (*)    Hgb urine dipstick MODERATE (*)    Bacteria, UA RARE (*)    All other components within normal limits  ETHANOL  CBC WITH DIFFERENTIAL/PLATELET  PREGNANCY, URINE    EKG EKG Interpretation Date/Time:  Wednesday September 13 2023 10:59:58 EST Ventricular Rate:  65 PR Interval:  132 QRS Duration:  97 QT Interval:  436 QTC Calculation: 454 R Axis:   71  Text Interpretation: Sinus rhythm Baseline wander Confirmed by Gerhard Munch [phone]) on 09/13/2023 2:30:57 PM  Radiology No results found.  Procedures Procedures    Medications Ordered in  ED Medications  sodium chloride 0.9 % bolus 1,000 mL (1,000 mLs Intravenous New Bag/Given 09/13/23 1149)  morphine (PF) 4 MG/ML injection 4 mg (4 mg Intravenous Given 09/13/23 1141)  ondansetron (ZOFRAN) injection 4 mg (4 mg Intravenous Given 09/13/23 1145)  sucralfate (CARAFATE) 1 GM/10ML suspension 1 g (1 g Oral Given 09/13/23 1147)  famotidine (PEPCID) IVPB 20 mg premix (20 mg Intravenous New Bag/Given 09/13/23 1148)  0.9 % NaCl with KCl 20 mEq/ L  infusion ( Intravenous New Bag/Given 09/13/23 1400)  iohexol (OMNIPAQUE) 300 MG/ML solution 100 mL (100 mLs Intravenous Contrast Given 09/13/23 1333)    ED Course/ Medical Decision Making/ A&P                                 Medical Decision Making Adult female presents with epigastric pain.  Broad differential including hepatobiliary dysfunction, gastritis, ACS given the location of pain.  Lower suspicion for ACS given the duration of symptoms absence of substantial risk factors beyond cigarettes. Patient has not smoker, no pancreatitis remains a consideration.  Cardiac 80 sinus normal Pulse ox 100% room air normal Morphine fluid Zofran Pepcid Carafate ordered.  Amount and/or Complexity of Data Reviewed External Data Reviewed: notes. Labs: ordered. Decision-making details documented in ED Course. Radiology: ordered and independent interpretation performed. Decision-making details documented in ED Course. ECG/medicine tests: ordered and independent interpretation performed. Decision-making details documented in ED Course.  Risk Prescription drug management.   4:31 PM On repeat exam patient in similar condition, awake, alert, continues to complain of pain.  Initial IV fluid resuscitation with potassium has completed, and given her critically low potassium value she will receive additional runs. Patient's vomiting has improved with antiemetics, but now with multiple doses of narcotics for pain control, multiple boluses of potassium for  normalization, and with abnormal CT, concern for pancreatitis, possibly with MRCP required, I discussed case with our hospitalist colleagues, patient admitted for further monitoring, management.   Final Clinical Impression(s) / ED Diagnoses Final diagnoses:  Acute pancreatitis without infection or necrosis, unspecified pancreatitis type  Hypokalemia   CRITICAL CARE Performed by: Gerhard Munch Total critical care time: 35 minutes Critical care time was exclusive of separately billable procedures and treating other patients. Critical care was necessary to treat or prevent imminent or life-threatening deterioration. Critical care was time spent personally by me on the following activities: development of treatment plan with patient and/or surrogate as well as nursing, discussions with consultants, evaluation of patient's response to treatment, examination of patient, obtaining history from patient or surrogate, ordering and performing treatments and interventions, ordering and review of laboratory  studies, ordering and review of radiographic studies, pulse oximetry and re-evaluation of patient's condition.    Gerhard Munch, MD 09/13/23 [phone]

## 2023-09-13 NOTE — ED Triage Notes (Addendum)
Pt c/o lower abdominal pain and n/v x1 week.  Pain score 10/10.  Hx of ovarian cysts.     After completing triage, Pt mentioned sometimes the pain is in her upper abdomen as well.

## 2023-09-14 DIAGNOSIS — K639 Disease of intestine, unspecified: Secondary | ICD-10-CM

## 2023-09-14 DIAGNOSIS — E876 Hypokalemia: Secondary | ICD-10-CM

## 2023-09-14 DIAGNOSIS — D649 Anemia, unspecified: Secondary | ICD-10-CM

## 2023-09-14 DIAGNOSIS — R1013 Epigastric pain: Secondary | ICD-10-CM

## 2023-09-14 DIAGNOSIS — R1010 Upper abdominal pain, unspecified: Secondary | ICD-10-CM | POA: Diagnosis not present

## 2023-09-14 DIAGNOSIS — R112 Nausea with vomiting, unspecified: Secondary | ICD-10-CM

## 2023-09-14 LAB — COMPREHENSIVE METABOLIC PANEL
ALT: 11 U/L (ref 0–44)
AST: 14 U/L — ABNORMAL LOW (ref 15–41)
Albumin: 3.1 g/dL — ABNORMAL LOW (ref 3.5–5.0)
Alkaline Phosphatase: 51 U/L (ref 38–126)
Anion gap: 11 (ref 5–15)
BUN: <5 mg/dL — ABNORMAL LOW (ref 6–20)
CO2: 24 mmol/L (ref 22–32)
Calcium: 8.8 mg/dL — ABNORMAL LOW (ref 8.9–10.3)
Chloride: 102 mmol/L (ref 98–111)
Creatinine, Ser: 0.73 mg/dL (ref 0.44–1.00)
GFR, Estimated: >60 mL/min (ref >60)
Glucose, Bld: 112 mg/dL — ABNORMAL HIGH (ref 70–99)
Potassium: 3.9 mmol/L (ref 3.5–5.1)
Sodium: 137 mmol/L (ref 135–145)
Total Bilirubin: 0.5 mg/dL (ref <1.2)
Total Protein: 6.5 g/dL (ref 6.5–8.1)

## 2023-09-14 LAB — CBC
HCT: 35.5 % — ABNORMAL LOW (ref 36.0–46.0)
Hemoglobin: 11.7 g/dL — ABNORMAL LOW (ref 12.0–15.0)
MCH: 29.7 pg (ref 26.0–34.0)
MCHC: 33 g/dL (ref 30.0–36.0)
MCV: 90.1 fL (ref 80.0–100.0)
Platelets: 191 10*3/uL (ref 150–400)
RBC: 3.94 MIL/uL (ref 3.87–5.11)
RDW: 14 % (ref 11.5–15.5)
WBC: 9.9 10*3/uL (ref 4.0–10.5)
nRBC: 0 % (ref 0.0–0.2)

## 2023-09-14 LAB — OCCULT BLOOD X 1 CARD TO LAB, STOOL: Fecal Occult Bld: NEGATIVE

## 2023-09-14 LAB — SEDIMENTATION RATE: Sed Rate: 17 mm/h (ref 0–22)

## 2023-09-14 LAB — HIV ANTIBODY (ROUTINE TESTING W REFLEX): HIV Screen 4th Generation wRfx: NONREACTIVE

## 2023-09-14 LAB — MAGNESIUM: Magnesium: 1.5 mg/dL — ABNORMAL LOW (ref 1.7–2.4)

## 2023-09-14 LAB — IRON AND TIBC
Iron: 66 ug/dL (ref 28–170)
Saturation Ratios: 19 % (ref 10.4–31.8)
TIBC: 356 ug/dL (ref 250–450)
UIBC: 290 ug/dL

## 2023-09-14 LAB — VITAMIN B12: Vitamin B-12: 202 pg/mL (ref 180–914)

## 2023-09-14 LAB — FERRITIN: Ferritin: 25 ng/mL (ref 11–307)

## 2023-09-14 LAB — C-REACTIVE PROTEIN: CRP: 0.5 mg/dL (ref <1.0)

## 2023-09-14 MED ORDER — PEG-KCL-NACL-NASULF-NA ASC-C 100 G PO SOLR
0.5000 | Freq: Once | ORAL | Status: AC
  Administered 2023-09-14: 100 g via ORAL
  Filled 2023-09-14: qty 1

## 2023-09-14 MED ORDER — PANTOPRAZOLE SODIUM 40 MG IV SOLR
40.0000 mg | Freq: Two times a day (BID) | INTRAVENOUS | Status: DC
Start: 2023-09-15 — End: 2023-09-15
  Administered 2023-09-15: 40 mg via INTRAVENOUS
  Filled 2023-09-14: qty 10

## 2023-09-14 MED ORDER — HYDROMORPHONE HCL 1 MG/ML IJ SOLN
0.5000 mg | Freq: Four times a day (QID) | INTRAMUSCULAR | Status: DC | PRN
  Administered 2023-09-14: 0.5 mg via INTRAVENOUS
  Filled 2023-09-14: qty 0.5

## 2023-09-14 MED ORDER — POTASSIUM CHLORIDE 10 MEQ/100ML IV SOLN
10.0000 meq | INTRAVENOUS | Status: AC
  Administered 2023-09-14 (×3): 10 meq via INTRAVENOUS
  Filled 2023-09-14 (×3): qty 100

## 2023-09-14 MED ORDER — PEG-KCL-NACL-NASULF-NA ASC-C 100 G PO SOLR: 1.0000 | Freq: Once | ORAL | Status: DC

## 2023-09-14 MED ORDER — LACTATED RINGERS IV SOLN: INTRAVENOUS | Status: AC

## 2023-09-14 MED ORDER — PANTOPRAZOLE SODIUM 40 MG IV SOLR
40.0000 mg | Freq: Every day | INTRAVENOUS | Status: DC
Start: 2023-09-15 — End: 2023-09-14

## 2023-09-14 MED ORDER — PEG-KCL-NACL-NASULF-NA ASC-C 100 G PO SOLR
0.5000 | Freq: Once | ORAL | Status: AC
  Administered 2023-09-15: 100 g via ORAL
  Filled 2023-09-14: qty 1

## 2023-09-14 MED ORDER — HYDROMORPHONE HCL 1 MG/ML IJ SOLN
0.5000 mg | INTRAMUSCULAR | Status: DC | PRN
  Administered 2023-09-14 – 2023-09-15 (×6): 0.5 mg via INTRAVENOUS
  Filled 2023-09-14 (×6): qty 0.5

## 2023-09-14 MED ORDER — OXYCODONE HCL 5 MG PO TABS
5.0000 mg | ORAL_TABLET | ORAL | Status: DC | PRN
  Administered 2023-09-14 – 2023-09-15 (×4): 5 mg via ORAL
  Filled 2023-09-14 (×4): qty 1
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
## 2023-09-14 NOTE — Assessment & Plan Note (Addendum)
-   Prior hemoglobin levels appear to be around 10 to 11 g/dL.  Did improve in October 2023 labs to around 12 to 13 g/dL -May have been hemoconcentrated on admission as notable drop overnight after fluids from 14.5 g/dL down to 42.5 g/dL on 95/6 and no significant reports of bleeding - FOBT negative  -Iron studies adequate - Hemoglobin remained stable and improved at discharge
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
## 2023-09-14 NOTE — Assessment & Plan Note (Addendum)
repleted ?

## 2023-09-14 NOTE — Assessment & Plan Note (Addendum)
-   no inflammation noted on MRCP around pancreas; lipase mildly elevated and exam not consistent with pancreatitis. I believe this to be ruled out per lack of criteria - more concerning is BC powder and ibuprofen use along with ongoing tobacco use and epigastric pain with GERD sxms; plus prior history of similar in Oct 2023 - EGD performed 11/8; notable for esophagitis and gastritis, no bleeding - continue protonix BID at discharge - patient instructed for no further BC powder use

## 2023-09-14 NOTE — Hospital Course (Addendum)
Veronica Blackwell is a 42 yo female with PMH ongoing tobacco use, headaches (treated with BC powder and ibuprofen) who presented with worsening abdominal pain and nausea/vomiting.  She denied similar episodes in the past but epic shows similar eval 08/11/22 ER visit at [facility] for abd pain, nausea, dark stools.  She declined admission at that time.  CT A/P was also obtained which showed colonic thickening involving proximal to mid transverse colon.  She again underwent repeat CT A/P this hospitalization which showed ongoing colonic wall thickening of the ascending and transverse colon.  Prominent pancreatic duct measuring 5 mm was also noted.  MRCP was then obtained which did not show any acute inflammatory findings.  Lipase was mildly elevated, 78.  Her pain was mostly reported to be epigastric in location with radiation upwards.  She did not have any significant left or right sided tenderness.  She was originally admitted for pancreatitis and placed on bowel rest and fluids.  With further collateral information, she reports taking 2-3 BC powders daily and sometimes will use ibuprofen.  She continues to endorse ongoing tobacco use as well.  She denies any significant alcohol use. Given concern for underlying ulcerative disease and the persistent colonic thickening she was recommended to have GI evaluation in case of need for further inpatient workup regarding PUD and/or colonoscopy.   She underwent EGD and colonoscopy on 09/15/2023.  Colonoscopy was notable for 1 polyp in the sigmoid colon which was removed.  EGD showed LA grade C esophagitis with biopsies taken, small hiatal hernia, and gastritis.  She was continued on Protonix 40 mg twice daily and strongly recommended to abstain from further use of BC powder or other NSAIDs.

## 2023-09-14 NOTE — Assessment & Plan Note (Addendum)
-   No family history of colon cancer or GI issues per patient - Prior CT October 2023 notes similar colonic wall thickening in the proximal to mid transverse colon - Repeat CT this admission shows ongoing colonic wall thickening involving ascending and transverse colon - Concern would be for underlying IBD - Check ESR and CRP = both normal - no abnormalities noted on colonoscopy on 11/8; had 1 polyp removed

## 2023-09-14 NOTE — Plan of Care (Signed)

## 2023-09-14 NOTE — Progress Notes (Signed)
Progress Note    Veronica Blackwell   VWU:[id number]  DOB: 1981/08/30  DOA: 09/13/2023     1 PCP: Veronica Blackwell  Initial CC: abdominal pain  Hospital Course: Veronica Blackwell is a 42 yo female with PMH ongoing tobacco use, headaches (treated with BC powder and ibuprofen) who presented with worsening abdominal pain and nausea/vomiting.  She denied similar episodes in the past but epic shows similar eval 08/11/22 ER visit at [facility] for abd pain, nausea, dark stools.  She declined admission at that time.  CT A/P was also obtained which showed colonic thickening involving proximal to mid transverse colon.  She again underwent repeat CT A/P this hospitalization which showed ongoing colonic wall thickening of the ascending and transverse colon.  Prominent pancreatic duct measuring 5 mm was also noted.  MRCP was then obtained which did not show any acute inflammatory findings.  Lipase was mildly elevated, 78.  Her pain was mostly reported to be epigastric in location with radiation upwards.  She did not have any significant left or right sided tenderness.  She was originally admitted for pancreatitis and placed on bowel rest and fluids.  With further collateral information, she reports taking 2-3 BC powders daily and sometimes will use ibuprofen.  She continues to endorse ongoing tobacco use as well.  She denies any significant alcohol use. Given concern for underlying ulcerative disease and the persistent colonic thickening she was recommended to have GI evaluation in case of need for further inpatient workup regarding PUD and/or colonoscopy.   Interval History:  Veronica Blackwell present bedside this morning. Patient still having pain; on exam it's epigastric and no tenderness noted in left or right quadrants. She endorses ongoing BC powder use at least 2-3 times daily for headache/migraine control.  Reviewed findings on CT with her.  No known family history of IBD or colon cancer. She is amenable for GI  consult and further workup if deemed necessary during hospitalization.  Assessment and Plan: * Abdominal pain - no inflammation noted on MRCP around pancreas; lipase mildly elevated and exam not consistent with pancreatitis. I believe this to be ruled out Blackwell lack of criteria - more concerning is BC powder and ibuprofen use along with ongoing tobacco use and epigastric pain with GERD sxms; plus prior history of similar in Oct 2023 - will place back on bowel rest of now until GI evaluation - change PPI to IV BID - continue on LR  Colon wall thickening - No family history of colon cancer or GI issues Blackwell patient - Prior CT October 2023 notes similar colonic wall thickening in the proximal to mid transverse colon - Repeat CT this admission shows ongoing colonic wall thickening involving ascending and transverse colon - Concern would be for underlying IBD - Check ESR and CRP - GI following  Normocytic anemia - Prior hemoglobin levels appear to be around 10 to 11 g/dL.  Did improve on October 2023 labs to around 12 to 13 g/dL -May have been hemoconcentrated on admission as notable drop overnight after fluids from 14.5 g/dL down to 29.5 g/dL this morning and no significant reports of bleeding - check FOBT - check iron studies   Hypomagnesemia - replete as needed  Hypokalemia - replete as needed   Old records reviewed in assessment of this patient  Antimicrobials:   DVT prophylaxis:  enoxaparin (LOVENOX) injection 40 mg Start: 09/13/23 1800   Code Status:   Code Status: Full Code  Mobility Assessment (Last 72 Hours)  Mobility Assessment     Row Name 09/14/23 0830 09/13/23 2255 09/13/23 1900       Does patient have an order for bedrest or is patient medically unstable No - Continue assessment No - Continue assessment No - Continue assessment     What is the highest level of mobility based on the progressive mobility assessment? Level 6 (Walks independently in room and  hall) - Balance while walking in room without assist - Complete Level 6 (Walks independently in room and hall) - Balance while walking in room without assist - Complete Level 6 (Walks independently in room and hall) - Balance while walking in room without assist - Complete              Barriers to discharge: none Disposition Plan:  Home Status is: Inpt  Objective: Blood pressure (!) 150/80, pulse 60, temperature 98.8 F (37.1 C), temperature source Oral, resp. rate 18, height 5\' 3"  (1.6 m), weight 70.8 kg, SpO2 99%.  Examination:  Physical Exam Constitutional:      Appearance: Normal appearance.  HENT:     Head: Normocephalic and atraumatic.     Mouth/Throat:     Mouth: Mucous membranes are moist.  Eyes:     Extraocular Movements: Extraocular movements intact.  Cardiovascular:     Rate and Rhythm: Normal rate and regular rhythm.  Pulmonary:     Effort: Pulmonary effort is normal. No respiratory distress.     Breath sounds: Normal breath sounds. No wheezing.  Abdominal:     General: Bowel sounds are normal. There is no distension.     Palpations: Abdomen is soft.     Tenderness: There is abdominal tenderness in the epigastric area.  Musculoskeletal:        General: Normal range of motion.     Cervical back: Normal range of motion and neck supple.  Skin:    General: Skin is warm and dry.  Neurological:     General: No focal deficit present.     Mental Status: She is alert.  Psychiatric:        Mood and Affect: Mood normal.      Consultants:  GI  Procedures:    Data Reviewed: Results for orders placed or performed during the hospital encounter of 09/13/23 (from the past 24 hour(s))  Comprehensive metabolic panel     Status: Abnormal   Collection Time: 09/14/23  7:00 AM  Result Value Ref Range   Sodium 137 135 - 145 mmol/L   Potassium 3.9 3.5 - 5.1 mmol/L   Chloride 102 98 - 111 mmol/L   CO2 24 22 - 32 mmol/L   Glucose, Bld 112 (H) 70 - 99 mg/dL   BUN <5 (L)  6 - 20 mg/dL   Creatinine, Ser 4.09 0.44 - 1.00 mg/dL   Calcium 8.8 (L) 8.9 - 10.3 mg/dL   Total Protein 6.5 6.5 - 8.1 g/dL   Albumin 3.1 (L) 3.5 - 5.0 g/dL   AST 14 (L) 15 - 41 U/L   ALT 11 0 - 44 U/L   Alkaline Phosphatase 51 38 - 126 U/L   Total Bilirubin 0.5 <1.2 mg/dL   GFR, Estimated >81 >19 mL/min   Anion gap 11 5 - 15  CBC     Status: Abnormal   Collection Time: 09/14/23  7:00 AM  Result Value Ref Range   WBC 9.9 4.0 - 10.5 K/uL   RBC 3.94 3.87 - 5.11 MIL/uL   Hemoglobin 11.7 (L) 12.0 -  15.0 g/dL   HCT 02.7 (L) 25.3 - 66.4 %   MCV 90.1 80.0 - 100.0 fL   MCH 29.7 26.0 - 34.0 pg   MCHC 33.0 30.0 - 36.0 g/dL   RDW 40.3 47.4 - 25.9 %   Platelets 191 150 - 400 K/uL   nRBC 0.0 0.0 - 0.2 %  Magnesium     Status: Abnormal   Collection Time: 09/14/23  7:00 AM  Result Value Ref Range   Magnesium 1.5 (L) 1.7 - 2.4 mg/dL  HIV Antibody (routine testing w rflx)     Status: None   Collection Time: 09/14/23  7:00 AM  Result Value Ref Range   HIV Screen 4th Generation wRfx Non Reactive Non Reactive    I have reviewed pertinent nursing notes, vitals, labs, and images as necessary. I have ordered labwork to follow up on as indicated.  I have reviewed the last notes from staff over past 24 hours. I have discussed patient's care plan and test results with nursing staff, CM/SW, and other staff as appropriate.  Time spent: Greater than 50% of the 55 minute visit was spent in counseling/coordination of care for the patient as laid out in the A&P.   LOS: 1 day   Lewie Chamber, MD [facility] 09/14/2023, 1:23 PM
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
## 2023-09-15 ENCOUNTER — Inpatient Hospital Stay (HOSPITAL_COMMUNITY): Payer: Medicaid Other | Admitting: Anesthesiology

## 2023-09-15 ENCOUNTER — Encounter (HOSPITAL_COMMUNITY): Payer: Self-pay | Admitting: Student

## 2023-09-15 ENCOUNTER — Encounter (HOSPITAL_COMMUNITY)
Admission: EM | Disposition: A | Discharge status: Home / Self Care | Payer: Self-pay | Source: Home / Self Care | Attending: Internal Medicine

## 2023-09-15 DIAGNOSIS — R112 Nausea with vomiting, unspecified: Secondary | ICD-10-CM | POA: Diagnosis not present

## 2023-09-15 DIAGNOSIS — K2100 Gastro-esophageal reflux disease with esophagitis, without bleeding: Secondary | ICD-10-CM | POA: Diagnosis not present

## 2023-09-15 DIAGNOSIS — R933 Abnormal findings on diagnostic imaging of other parts of digestive tract: Secondary | ICD-10-CM

## 2023-09-15 DIAGNOSIS — R1084 Generalized abdominal pain: Secondary | ICD-10-CM

## 2023-09-15 DIAGNOSIS — K2900 Acute gastritis without bleeding: Secondary | ICD-10-CM

## 2023-09-15 DIAGNOSIS — K648 Other hemorrhoids: Secondary | ICD-10-CM

## 2023-09-15 DIAGNOSIS — K209 Esophagitis, unspecified without bleeding: Secondary | ICD-10-CM | POA: Diagnosis not present

## 2023-09-15 DIAGNOSIS — K2970 Gastritis, unspecified, without bleeding: Secondary | ICD-10-CM | POA: Diagnosis not present

## 2023-09-15 DIAGNOSIS — K635 Polyp of colon: Secondary | ICD-10-CM

## 2023-09-15 DIAGNOSIS — R109 Unspecified abdominal pain: Secondary | ICD-10-CM | POA: Diagnosis not present

## 2023-09-15 DIAGNOSIS — K529 Noninfective gastroenteritis and colitis, unspecified: Secondary | ICD-10-CM | POA: Diagnosis not present

## 2023-09-15 HISTORY — PX: ESOPHAGOGASTRODUODENOSCOPY (EGD) WITH PROPOFOL: SHX5813

## 2023-09-15 HISTORY — PX: COLONOSCOPY WITH PROPOFOL: SHX5780

## 2023-09-15 HISTORY — PX: BIOPSY: SHX5522

## 2023-09-15 HISTORY — PX: POLYPECTOMY: SHX5525

## 2023-09-15 LAB — BASIC METABOLIC PANEL
Anion gap: 8 (ref 5–15)
BUN: <5 mg/dL — ABNORMAL LOW (ref 6–20)
CO2: 27 mmol/L (ref 22–32)
Calcium: 9 mg/dL (ref 8.9–10.3)
Chloride: 103 mmol/L (ref 98–111)
Creatinine, Ser: 0.68 mg/dL (ref 0.44–1.00)
GFR, Estimated: >60 mL/min (ref >60)
Glucose, Bld: 107 mg/dL — ABNORMAL HIGH (ref 70–99)
Potassium: 3.5 mmol/L (ref 3.5–5.1)
Sodium: 138 mmol/L (ref 135–145)

## 2023-09-15 LAB — CBC WITH DIFFERENTIAL/PLATELET
Abs Immature Granulocytes: 0 10*3/uL (ref 0.00–0.07)
Basophils Absolute: 0.1 10*3/uL (ref 0.0–0.1)
Basophils Relative: 1 %
Eosinophils Absolute: 0 10*3/uL (ref 0.0–0.5)
Eosinophils Relative: 0 %
HCT: 38.7 % (ref 36.0–46.0)
Hemoglobin: 12.7 g/dL (ref 12.0–15.0)
Lymphocytes Relative: 24 %
Lymphs Abs: 2.6 10*3/uL (ref 0.7–4.0)
MCH: 30 pg (ref 26.0–34.0)
MCHC: 32.8 g/dL (ref 30.0–36.0)
MCV: 91.3 fL (ref 80.0–100.0)
Monocytes Absolute: 0.5 10*3/uL (ref 0.1–1.0)
Monocytes Relative: 5 %
Neutro Abs: 7.6 10*3/uL (ref 1.7–7.7)
Neutrophils Relative %: 70 %
Platelets: 190 10*3/uL (ref 150–400)
RBC: 4.24 MIL/uL (ref 3.87–5.11)
RDW: 14 % (ref 11.5–15.5)
WBC: 10.8 10*3/uL — ABNORMAL HIGH (ref 4.0–10.5)
nRBC: 0 % (ref 0.0–0.2)
nRBC: 0 /100{WBCs}

## 2023-09-15 LAB — MAGNESIUM: Magnesium: 1.8 mg/dL (ref 1.7–2.4)

## 2023-09-15 LAB — IGG 4: IgG, Subclass 4: 107 mg/dL — ABNORMAL HIGH (ref 2–96)

## 2023-09-15 LAB — ANA W/REFLEX IF POSITIVE: Anti Nuclear Antibody (ANA): NEGATIVE

## 2023-09-15 SURGERY — COLONOSCOPY WITH PROPOFOL
Anesthesia: Monitor Anesthesia Care

## 2023-09-15 MED ORDER — LIDOCAINE 2% (20 MG/ML) 5 ML SYRINGE
INTRAMUSCULAR | Status: DC | PRN
  Administered 2023-09-15: 100 mg via INTRAVENOUS

## 2023-09-15 MED ORDER — FENTANYL CITRATE (PF) 100 MCG/2ML IJ SOLN: 25.0000 ug | INTRAMUSCULAR | Status: DC | PRN

## 2023-09-15 MED ORDER — SODIUM CHLORIDE 0.9 % IV SOLN: INTRAVENOUS | Status: DC

## 2023-09-15 MED ORDER — DEXMEDETOMIDINE HCL IN NACL 80 MCG/20ML IV SOLN
INTRAVENOUS | Status: AC
  Filled 2023-09-15: qty 20

## 2023-09-15 MED ORDER — VITAMIN B-12 1000 MCG PO TABS: 1000.0000 ug | ORAL_TABLET | Freq: Every day | ORAL | Status: AC

## 2023-09-15 MED ORDER — OXYCODONE HCL 5 MG/5ML PO SOLN: 5.0000 mg | Freq: Once | ORAL | Status: DC | PRN

## 2023-09-15 MED ORDER — DEXMEDETOMIDINE HCL IN NACL 80 MCG/20ML IV SOLN
INTRAVENOUS | Status: DC | PRN
  Administered 2023-09-15 (×2): 4 ug via INTRAVENOUS

## 2023-09-15 MED ORDER — PANTOPRAZOLE SODIUM 40 MG PO TBEC: 40.0000 mg | DELAYED_RELEASE_TABLET | Freq: Two times a day (BID) | ORAL | 0 refills | Status: DC

## 2023-09-15 MED ORDER — ONDANSETRON HCL 4 MG/2ML IJ SOLN: 4.0000 mg | Freq: Four times a day (QID) | INTRAMUSCULAR | Status: DC | PRN

## 2023-09-15 MED ORDER — OXYCODONE HCL 5 MG PO TABS: 5.0000 mg | ORAL_TABLET | Freq: Once | ORAL | Status: DC | PRN

## 2023-09-15 MED ORDER — PANTOPRAZOLE SODIUM 40 MG PO TBEC: 40.0000 mg | DELAYED_RELEASE_TABLET | Freq: Two times a day (BID) | ORAL | 0 refills | Status: AC

## 2023-09-15 MED ORDER — PROCHLORPERAZINE EDISYLATE 10 MG/2ML IJ SOLN
5.0000 mg | Freq: Four times a day (QID) | INTRAMUSCULAR | Status: DC | PRN
  Administered 2023-09-15: 5 mg via INTRAVENOUS
  Filled 2023-09-15: qty 2

## 2023-09-15 MED ORDER — PROPOFOL 10 MG/ML IV BOLUS
INTRAVENOUS | Status: DC | PRN
  Administered 2023-09-15: 100 mg via INTRAVENOUS
  Administered 2023-09-15: 75 ug/kg/min via INTRAVENOUS
  Administered 2023-09-15: 50 mg via INTRAVENOUS

## 2023-09-15 MED ORDER — OXYCODONE HCL 5 MG PO TABS: 5.0000 mg | ORAL_TABLET | ORAL | 0 refills | Status: AC | PRN

## 2023-09-15 SURGICAL SUPPLY — 25 items

## 2023-09-15 NOTE — Op Note (Signed)
[facility] Patient Name: Veronica Blackwell Procedure Date : 09/15/2023 MRN: [id number] Attending MD: Napoleon Form , MD, [id number] Date of Birth: 14-Apr-1981 CSN: [id number] Age: 42 Admit Type: Inpatient Procedure:                Upper GI endoscopy Indications:              Epigastric abdominal pain, Nausea with vomiting Providers:                Napoleon Form, MD, Lorenza Evangelist, RN,                            Salley Scarlet, Technician, Maryjean Morn, CRNA Referring MD:              Medicines:                Monitored Anesthesia Care Complications:            No immediate complications. Estimated Blood Loss:     Estimated blood loss was minimal. Procedure:                Pre-Anesthesia Assessment:                           - Prior to the procedure, a History and Physical                            was performed, and patient medications and                            allergies were reviewed. The patient's tolerance of                            previous anesthesia was also reviewed. The risks                            and benefits of the procedure and the sedation                            options and risks were discussed with the patient.                            All questions were answered, and informed consent                            was obtained. Prior Anticoagulants: The patient has                            taken no anticoagulant or antiplatelet agents. ASA                            Grade Assessment: II - A patient with mild systemic                            disease. After reviewing the risks and benefits,  the patient was deemed in satisfactory condition to                            undergo the procedure.                           After obtaining informed consent, the endoscope was                            passed under direct vision. Throughout the                            procedure, the patient's blood  pressure, pulse, and                            oxygen saturations were monitored continuously. The                            [id number]-[id number] ([id number]) Olympus endoscope was introduced                            through the mouth, and advanced to the second part                            of duodenum. The upper GI endoscopy was                            accomplished without difficulty. The patient                            tolerated the procedure well. Scope In: Scope Out: Findings:      LA Grade C (one or more mucosal breaks continuous between tops of 2 or       more mucosal folds, less than 75% circumference) esophagitis with no       bleeding was found 34 to 40 cm from the incisors. Biopsies were taken       with a cold forceps for histology.      A small hiatal hernia was present.      Patchy mild inflammation characterized by congestion (edema), erosions,       erythema and friability was found in the gastric antrum and at the       pylorus. Biopsies were taken with a cold forceps for Helicobacter pylori       testing.      The cardia and gastric fundus were normal on retroflexion.      The examined duodenum was normal. Impression:               - LA Grade C reflux esophagitis with no bleeding.                            Biopsied.                           - Small hiatal hernia.                           -  Gastritis. Biopsied.                           - Normal examined duodenum. Recommendation:           - Resume previous diet.                           - Continue present medications.                           - Await pathology results.                           - No ibuprofen, naproxen, or other non-steroidal                            anti-inflammatory drugs.                           - Follow an antireflux regimen.                           - Use Protonix (pantoprazole) 40 mg PO BID for 3                            months.                           - Avoid Cocaine and  Marijuana                           - GI will sign off, available if have any questions Procedure Code(s):        --- Professional ---                           [phone], Esophagogastroduodenoscopy, flexible,                            transoral; with biopsy, single or multiple Diagnosis Code(s):        --- Professional ---                           K21.00, Gastro-esophageal reflux disease with                            esophagitis, without bleeding                           K44.9, Diaphragmatic hernia without obstruction or                            gangrene                           K29.70, Gastritis, unspecified, without bleeding                           R10.13, Epigastric pain  R11.2, Nausea with vomiting, unspecified CPT copyright 2022 [employer]. All rights reserved. The codes documented in this report are preliminary and upon coder review may  be revised to meet current compliance requirements. Napoleon Form, MD 09/15/2023 10:37:10 AM This report has been signed electronically. Number of Addenda: 0

## 2023-09-15 NOTE — Anesthesia Preprocedure Evaluation (Signed)
Anesthesia Evaluation  Patient identified by MRN, date of birth, ID band Patient awake    Reviewed: Allergy & Precautions, H&P , NPO status , Patient's Chart, lab work & pertinent test results  Airway Mallampati: II   Neck ROM: full    Dental   Pulmonary Current Smoker   breath sounds clear to auscultation       Cardiovascular negative cardio ROS  Rhythm:regular Rate:Normal     Neuro/Psych  Headaches    GI/Hepatic ,,,(+)     substance abuse  cocaine use  Endo/Other    Renal/GU      Musculoskeletal   Abdominal   Peds  Hematology   Anesthesia Other Findings   Reproductive/Obstetrics                             Anesthesia Physical Anesthesia Plan  ASA: 2  Anesthesia Plan: MAC   Post-op Pain Management:    Induction: Intravenous  PONV Risk Score and Plan: 1 and Propofol infusion and Treatment may vary due to age or medical condition  Airway Management Planned: Nasal Cannula  Additional Equipment:   Intra-op Plan:   Post-operative Plan:   Informed Consent: I have reviewed the patients History and Physical, chart, labs and discussed the procedure including the risks, benefits and alternatives for the proposed anesthesia with the patient or authorized representative who has indicated his/her understanding and acceptance.     Dental advisory given  Plan Discussed with: CRNA, Anesthesiologist and Surgeon  Anesthesia Plan Comments:        Anesthesia Quick Evaluation

## 2023-09-15 NOTE — Op Note (Signed)
[facility] Patient Name: Veronica Blackwell Procedure Date : 09/15/2023 MRN: [id number] Attending MD: Napoleon Form , MD, [id number] Date of Birth: 11/21/1980 CSN: [id number] Age: 42 Admit Type: Inpatient Procedure:                Colonoscopy Indications:              Generalized abdominal pain, Abnormal CT of the GI                            tract Providers:                Napoleon Form, MD, Lorenza Evangelist, RN,                            Salley Scarlet, Technician, Maryjean Morn, CRNA Referring MD:              Medicines:                Monitored Anesthesia Care Complications:            No immediate complications. Estimated Blood Loss:     Estimated blood loss was minimal. Procedure:                Pre-Anesthesia Assessment:                           - Prior to the procedure, a History and Physical                            was performed, and patient medications and                            allergies were reviewed. The patient's tolerance of                            previous anesthesia was also reviewed. The risks                            and benefits of the procedure and the sedation                            options and risks were discussed with the patient.                            All questions were answered, and informed consent                            was obtained. Prior Anticoagulants: The patient has                            taken no anticoagulant or antiplatelet agents. ASA                            Grade Assessment: II - A patient with mild systemic  disease. After reviewing the risks and benefits,                            the patient was deemed in satisfactory condition to                            undergo the procedure.                           After obtaining informed consent, the colonoscope                            was passed under direct vision. Throughout the                             procedure, the patient's blood pressure, pulse, and                            oxygen saturations were monitored continuously. The                            [id number]-[id number] ([id number] Olympus peds colonoscope was                            introduced through the anus and advanced to the the                            cecum, identified by appendiceal orifice and                            ileocecal valve. The colonoscopy was performed                            without difficulty. The patient tolerated the                            procedure well. The quality of the bowel                            preparation was good. The ileocecal valve,                            appendiceal orifice, and rectum were photographed. Scope In: 9:57:42 AM Scope Out: 10:11:52 AM Scope Withdrawal Time: 0 hours 11 minutes 46 seconds  Total Procedure Duration: 0 hours 14 minutes 10 seconds  Findings:      The perianal and digital rectal examinations were normal.      A 5 mm polyp was found in the sigmoid colon. The polyp was sessile. The       polyp was removed with a cold snare. Resection and retrieval were       complete.      Normal mucosa was found in the entire colon.      Non-bleeding internal hemorrhoids were found during retroflexion. The       hemorrhoids were small. Impression:               -  One 5 mm polyp in the sigmoid colon, removed with                            a cold snare. Resected and retrieved.                           - Normal mucosa in the entire examined colon.                           - Non-bleeding internal hemorrhoids. Recommendation:           - Patient has a contact number available for                            emergencies. The signs and symptoms of potential                            delayed complications were discussed with the                            patient. Return to normal activities tomorrow.                            Written discharge instructions were provided to  the                            patient.                           - Resume previous diet.                           - Continue present medications.                           - Await pathology results.                           - Repeat colonoscopy in 7-10 years for surveillance                            based on pathology results. Procedure Code(s):        --- Professional ---                           [phone], Colonoscopy, flexible; with removal of                            tumor(s), polyp(s), or other lesion(s) by snare                            technique Diagnosis Code(s):        --- Professional ---                           D12.5, Benign neoplasm of sigmoid colon  K64.8, Other hemorrhoids                           R10.84, Generalized abdominal pain                           R93.3, Abnormal findings on diagnostic imaging of                            other parts of digestive tract CPT copyright 2022 [employer]. All rights reserved. The codes documented in this report are preliminary and upon coder review may  be revised to meet current compliance requirements. Napoleon Form, MD 09/15/2023 10:24:34 AM This report has been signed electronically. Number of Addenda: 0
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
## 2023-09-15 NOTE — Progress Notes (Signed)
Mobility Specialist Progress Note:   09/15/23 1126  Mobility  Activity Ambulated with assistance in hallway  Level of Assistance Contact guard assist, steadying assist  Assistive Device None  Distance Ambulated (ft) 200 ft  Activity Response Tolerated well  Mobility Referral Yes  $Mobility charge 1 Mobility  Mobility Specialist Start Time (ACUTE ONLY) 1117  Mobility Specialist Stop Time (ACUTE ONLY) 1125  Mobility Specialist Time Calculation (min) (ACUTE ONLY) 8 min   Pt received in bed, agreeable to mobility session. No AD required, CGA for safety. Tolerated well, asx throughout. Denies dizziness, pain or SOB. Returned pt back to room, all needs met, RN notified.   Feliciana Rossetti Mobility Specialist Please contact via Special educational needs teacher or  Rehab office at [phone]

## 2023-09-15 NOTE — Transfer of Care (Signed)
Immediate Anesthesia Transfer of Care Note  Patient: Veronica Blackwell  Procedure(s) Performed: COLONOSCOPY WITH PROPOFOL ESOPHAGOGASTRODUODENOSCOPY (EGD) WITH PROPOFOL BIOPSY POLYPECTOMY  Patient Location: Endoscopy Unit  Anesthesia Type:MAC  Level of Consciousness: sedated  Airway & Oxygen Therapy: Patient Spontanous Breathing and Patient connected to nasal cannula oxygen  Post-op Assessment: Report given to RN and Post -op Vital signs reviewed and stable  Post vital signs: Reviewed and stable  Last Vitals:  Vitals Value Taken Time  BP    Temp    Pulse    Resp    SpO2      Last Pain:  Vitals:   09/15/23 0848  TempSrc: Temporal  PainSc: 8       Patients Stated Pain Goal: 0 (09/15/23 0601)  Complications: No notable events documented.

## 2023-09-15 NOTE — Anesthesia Postprocedure Evaluation (Signed)
Anesthesia Post Note  Patient: Veronica Blackwell  Procedure(s) Performed: COLONOSCOPY WITH PROPOFOL ESOPHAGOGASTRODUODENOSCOPY (EGD) WITH PROPOFOL BIOPSY POLYPECTOMY     Patient location during evaluation: PACU Anesthesia Type: MAC Level of consciousness: awake and alert Pain management: pain level controlled Vital Signs Assessment: post-procedure vital signs reviewed and stable Respiratory status: spontaneous breathing, nonlabored ventilation, respiratory function stable and patient connected to nasal cannula oxygen Cardiovascular status: stable and blood pressure returned to baseline Postop Assessment: no apparent nausea or vomiting Anesthetic complications: no   No notable events documented.  Last Vitals:  Vitals:   09/15/23 1040 09/15/23 1050  BP: (!) 168/97 (!) 169/98  Pulse: (!) 59 62  Resp: 11 13  Temp:    SpO2: 100% 100%    Last Pain:  Vitals:   09/15/23 1112  TempSrc:   PainSc: 7                  Jodiann Ognibene S
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
## 2023-09-17 ENCOUNTER — Encounter (HOSPITAL_COMMUNITY): Payer: Self-pay | Admitting: Gastroenterology

## 2023-09-17 LAB — IGE: IgE (Immunoglobulin E), Serum: 473 [IU]/mL (ref 6–495)

## 2023-09-18 LAB — SURGICAL PATHOLOGY

## 2023-09-20 DIAGNOSIS — R112 Nausea with vomiting, unspecified: Secondary | ICD-10-CM

## 2023-09-20 DIAGNOSIS — R933 Abnormal findings on diagnostic imaging of other parts of digestive tract: Secondary | ICD-10-CM

## 2023-09-20 DIAGNOSIS — K635 Polyp of colon: Secondary | ICD-10-CM

## 2023-09-20 DIAGNOSIS — R1084 Generalized abdominal pain: Secondary | ICD-10-CM

## 2023-11-28 ENCOUNTER — Ambulatory Visit: Payer: Medicaid Other

## 2023-12-28 ENCOUNTER — Emergency Department (HOSPITAL_BASED_OUTPATIENT_CLINIC_OR_DEPARTMENT_OTHER): Payer: Medicaid Other | Admitting: Radiology

## 2023-12-28 ENCOUNTER — Other Ambulatory Visit: Payer: Self-pay

## 2023-12-28 ENCOUNTER — Emergency Department (HOSPITAL_BASED_OUTPATIENT_CLINIC_OR_DEPARTMENT_OTHER)
Admission: EM | Admit: 2023-12-28 | Discharge: 2023-12-28 | Disposition: A | Discharge status: Home / Self Care | Payer: Medicaid Other | Attending: Emergency Medicine | Admitting: Emergency Medicine

## 2023-12-28 ENCOUNTER — Encounter (HOSPITAL_BASED_OUTPATIENT_CLINIC_OR_DEPARTMENT_OTHER): Payer: Self-pay

## 2023-12-28 DIAGNOSIS — R112 Nausea with vomiting, unspecified: Secondary | ICD-10-CM | POA: Insufficient documentation

## 2023-12-28 DIAGNOSIS — R0981 Nasal congestion: Secondary | ICD-10-CM | POA: Diagnosis not present

## 2023-12-28 DIAGNOSIS — R0989 Other specified symptoms and signs involving the circulatory and respiratory systems: Secondary | ICD-10-CM | POA: Diagnosis not present

## 2023-12-28 DIAGNOSIS — R001 Bradycardia, unspecified: Secondary | ICD-10-CM | POA: Diagnosis not present

## 2023-12-28 DIAGNOSIS — Z20822 Contact with and (suspected) exposure to covid-19: Secondary | ICD-10-CM | POA: Insufficient documentation

## 2023-12-28 DIAGNOSIS — R079 Chest pain, unspecified: Secondary | ICD-10-CM | POA: Insufficient documentation

## 2023-12-28 DIAGNOSIS — Z91040 Latex allergy status: Secondary | ICD-10-CM | POA: Insufficient documentation

## 2023-12-28 DIAGNOSIS — R0789 Other chest pain: Secondary | ICD-10-CM | POA: Diagnosis not present

## 2023-12-28 LAB — COMPREHENSIVE METABOLIC PANEL
ALT: 8 U/L (ref 0–44)
AST: 17 U/L (ref 15–41)
Albumin: 4.5 g/dL (ref 3.5–5.0)
Alkaline Phosphatase: 65 U/L (ref 38–126)
Anion gap: 12 (ref 5–15)
BUN: 6 mg/dL (ref 6–20)
CO2: 26 mmol/L (ref 22–32)
Calcium: 10.1 mg/dL (ref 8.9–10.3)
Chloride: 100 mmol/L (ref 98–111)
Creatinine, Ser: 0.63 mg/dL (ref 0.44–1.00)
GFR, Estimated: >60 mL/min (ref >60)
Glucose, Bld: 120 mg/dL — ABNORMAL HIGH (ref 70–99)
Potassium: 4 mmol/L (ref 3.5–5.1)
Sodium: 138 mmol/L (ref 135–145)
Total Bilirubin: 0.5 mg/dL (ref 0.0–1.2)
Total Protein: 8.9 g/dL — ABNORMAL HIGH (ref 6.5–8.1)

## 2023-12-28 LAB — CBC
HCT: 43.7 % (ref 36.0–46.0)
Hemoglobin: 14.3 g/dL (ref 12.0–15.0)
MCH: 29.9 pg (ref 26.0–34.0)
MCHC: 32.7 g/dL (ref 30.0–36.0)
MCV: 91.2 fL (ref 80.0–100.0)
Platelets: 205 10*3/uL (ref 150–400)
RBC: 4.79 MIL/uL (ref 3.87–5.11)
RDW: 14.4 % (ref 11.5–15.5)
WBC: 11.3 10*3/uL — ABNORMAL HIGH (ref 4.0–10.5)
nRBC: 0 % (ref 0.0–0.2)

## 2023-12-28 LAB — LIPASE, BLOOD: Lipase: 10 U/L — ABNORMAL LOW (ref 11–51)

## 2023-12-28 LAB — RESP PANEL BY RT-PCR (RSV, FLU A&B, COVID)  RVPGX2
Influenza A by PCR: NEGATIVE
Influenza B by PCR: NEGATIVE
Resp Syncytial Virus by PCR: NEGATIVE
SARS Coronavirus 2 by RT PCR: NEGATIVE

## 2023-12-28 LAB — TROPONIN I (HIGH SENSITIVITY): Troponin I (High Sensitivity): 3 ng/L (ref <18)

## 2023-12-28 MED ORDER — PROCHLORPERAZINE MALEATE 10 MG PO TABS: 10.0000 mg | ORAL_TABLET | Freq: Two times a day (BID) | ORAL | 0 refills | Status: AC | PRN

## 2023-12-28 MED ORDER — PROCHLORPERAZINE EDISYLATE 10 MG/2ML IJ SOLN
10.0000 mg | Freq: Once | INTRAMUSCULAR | Status: AC
  Administered 2023-12-28: 10 mg via INTRAVENOUS
  Filled 2023-12-28: qty 2

## 2023-12-28 MED ORDER — LACTATED RINGERS IV BOLUS
1000.0000 mL | Freq: Once | INTRAVENOUS | Status: AC
  Administered 2023-12-28: 1000 mL via INTRAVENOUS

## 2023-12-28 MED ORDER — ONDANSETRON HCL 4 MG/2ML IJ SOLN
4.0000 mg | Freq: Once | INTRAMUSCULAR | Status: AC
  Administered 2023-12-28: 4 mg via INTRAVENOUS
  Filled 2023-12-28: qty 2

## 2023-12-28 NOTE — ED Provider Notes (Signed)
  EMERGENCY DEPARTMENT AT [facility] Provider Note   CSN: [id number] Arrival date & time: 12/28/23  1950     History  Chief Complaint  Patient presents with   Nasal Congestion   Emesis   Chest Pain    Veronica Blackwell is a 43 y.o. female.   Emesis Chest Pain Associated symptoms: vomiting      Patient states he started having trouble with nausea and vomiting this morning.  Patient states she has had multiple episodes throughout the day.  She is not having any abdominal pain.  She is not having any diarrhea.  Patient states her chest started hurting her after all of the vomiting.  She denies any fevers.  Her prior abdominal surgeries include a C-section.  Patient denies any regular alcohol or marijuana use  Home Medications Prior to Admission medications   Medication Sig Start Date End Date Taking? Authorizing Provider  prochlorperazine (COMPAZINE) 10 MG tablet Take 1 tablet (10 mg total) by mouth 2 (two) times daily as needed for nausea or vomiting. 12/28/23  Yes Linwood Dibbles, MD  cyanocobalamin (VITAMIN B12) 1000 MCG tablet Take 1 tablet (1,000 mcg total) by mouth daily. 09/15/23   Lewie Chamber, MD  oxyCODONE (ROXICODONE) 5 MG immediate release tablet Take 1 tablet (5 mg total) by mouth every 4 (four) hours as needed for moderate pain (pain score 4-6) or severe pain (pain score 7-10). 09/15/23   Lewie Chamber, MD  pantoprazole (PROTONIX) 40 MG tablet Take 1 tablet (40 mg total) by mouth 2 (two) times daily. 09/15/23 12/14/23  Lewie Chamber, MD      Allergies    Latex    Review of Systems   Review of Systems  Cardiovascular:  Positive for chest pain.  Gastrointestinal:  Positive for vomiting.    Physical Exam Updated Vital Signs BP (!) 171/86   Pulse 61   Temp 97.6 F (36.4 C) (Oral)   Resp 16   Ht 1.575 m (5\' 2" )   Wt 63.5 kg   SpO2 100%   BMI 25.61 kg/m  Physical Exam Vitals and nursing note reviewed.  Constitutional:      General: She is  not in acute distress.    Appearance: She is well-developed.  HENT:     Head: Normocephalic and atraumatic.     Right Ear: External ear normal.     Left Ear: External ear normal.  Eyes:     General: No scleral icterus.       Right eye: No discharge.        Left eye: No discharge.     Conjunctiva/sclera: Conjunctivae normal.  Neck:     Trachea: No tracheal deviation.  Cardiovascular:     Rate and Rhythm: Normal rate and regular rhythm.  Pulmonary:     Effort: Pulmonary effort is normal. No respiratory distress.     Breath sounds: Normal breath sounds. No stridor. No wheezing or rales.  Abdominal:     General: Bowel sounds are normal. There is no distension.     Palpations: Abdomen is soft.     Tenderness: There is no abdominal tenderness. There is no guarding or rebound.  Musculoskeletal:        General: No tenderness or deformity.     Cervical back: Neck supple.  Skin:    General: Skin is warm and dry.     Findings: No rash.  Neurological:     General: No focal deficit present.     Mental  Status: She is alert.     Cranial Nerves: No cranial nerve deficit, dysarthria or facial asymmetry.     Sensory: No sensory deficit.     Motor: No abnormal muscle tone or seizure activity.     Coordination: Coordination normal.  Psychiatric:        Mood and Affect: Mood normal.     ED Results / Procedures / Treatments   Labs (all labs ordered are listed, but only abnormal results are displayed) Labs Reviewed  CBC - Abnormal; Notable for the following components:      Result Value   WBC 11.3 (*)    All other components within normal limits  COMPREHENSIVE METABOLIC PANEL - Abnormal; Notable for the following components:   Glucose, Bld 120 (*)    Total Protein 8.9 (*)    All other components within normal limits  LIPASE, BLOOD - Abnormal; Notable for the following components:   Lipase 10 (*)    All other components within normal limits  RESP PANEL BY RT-PCR (RSV, FLU A&B, COVID)   [id number]  TROPONIN I (HIGH SENSITIVITY)    EKG EKG Interpretation Date/Time:  Thursday December 28 2023 20:12:04 EST Ventricular Rate:  58 PR Interval:  122 QRS Duration:  90 QT Interval:  466 QTC Calculation: 457 R Axis:   64  Text Interpretation: Sinus bradycardia with sinus arrhythmia T wave abnormality, consider anterior ischemia Abnormal ECG When compared with ECG of 13-Sep-2023 10:59, anterior t wave changes are similar to previous Confirmed by Linwood Dibbles [phone]) on 12/28/2023 8:16:02 PM  Radiology DG Chest 2 View Result Date: 12/28/2023 CLINICAL DATA:  Chest pain, congestion, nausea, vomiting EXAM: CHEST - 2 VIEW COMPARISON:  08/29/2007 FINDINGS: Heart and mediastinal contours are within normal limits. No focal opacities or effusions. No acute bony abnormality. IMPRESSION: No active cardiopulmonary disease. Electronically Signed   By: Charlett Nose M.D.   On: 12/28/2023 21:02    Procedures Procedures    Medications Ordered in ED Medications  lactated ringers bolus 1,000 mL (0 mLs Intravenous Stopped 12/28/23 2238)  ondansetron (ZOFRAN) injection 4 mg (4 mg Intravenous Given 12/28/23 2117)  prochlorperazine (COMPAZINE) injection 10 mg (10 mg Intravenous Given 12/28/23 2233)    ED Course/ Medical Decision Making/ A&P Clinical Course as of 12/28/23 2314  Thu Dec 28, 2023  2131 Covid food flu RSV negative. [JK]  2204 Lipase normal.  Troponin normal.  Metabolic panel unremarkable.  COVID flu RSV negative [JK]    Clinical Course User Index [JK] Linwood Dibbles, MD                                 Medical Decision Making Problems Addressed: Nausea and vomiting, unspecified vomiting type: acute illness or injury that poses a threat to life or bodily functions  Amount and/or Complexity of Data Reviewed Labs: ordered. Decision-making details documented in ED Course. Radiology: ordered and independent interpretation performed.  Risk Prescription drug management.   Patient  presented to the ED for evaluation of nausea vomiting.  She started having chest discomfort after vomiting.  ED workup reassuring.  No evidence of pneumonia.  EKG and cardiac enzymes not suggestive of acute coronary syndrome.  Metabolic panel does not show signs of hepatitis or pancreatitis.  Mild leukocytosis but patient's not having any abdominal pain.  Low suspicion for colitis diverticulitis appendicitis.  Patient was treated with IV fluids and antiemetics.  Her symptoms have improved and  she is feeling better.  Evaluation and diagnostic testing in the emergency department does not suggest an emergent condition requiring admission or immediate intervention beyond what has been performed at this time.  The patient is safe for discharge and has been instructed to return immediately for worsening symptoms, change in symptoms or any other concerns.        Final Clinical Impression(s) / ED Diagnoses Final diagnoses:  Nausea and vomiting, unspecified vomiting type    Rx / DC Orders ED Discharge Orders          Ordered    prochlorperazine (COMPAZINE) 10 MG tablet  2 times daily PRN        12/28/23 2313              Linwood Dibbles, MD 12/28/23 2314

## 2023-12-28 NOTE — Discharge Instructions (Signed)
 The test today in the ED were reassuring.  Take the medications to help with nausea and vomiting.  Return to the ER for recurrent or worsening symptoms.

## 2023-12-28 NOTE — ED Notes (Signed)
 RN reviewed discharge instructions with pt. Pt verbalized understanding and had no further questions. VSS upon discharge.

## 2023-12-28 NOTE — ED Triage Notes (Signed)
 Pt reports congestion, nausea, emesis and 8/10 chest pain. Pt reports these symptoms all started with morning. Pt falling asleep during triage.

## 2023-12-28 NOTE — ED Notes (Signed)
 Unable to obtain labs in triage
